# Patient Record
Sex: Female | Born: 1990 | Race: Black or African American | Hispanic: No | Marital: Single | Smoking: Never smoker
Health system: Southern US, Community
[De-identification: ages and names within clinical notes are randomized; demographics above are authoritative.]

## PROBLEM LIST (undated history)

## (undated) DIAGNOSIS — R87629 Unspecified abnormal cytological findings in specimens from vagina: Secondary | ICD-10-CM

## (undated) DIAGNOSIS — B009 Herpesviral infection, unspecified: Secondary | ICD-10-CM

## (undated) DIAGNOSIS — M940 Chondrocostal junction syndrome [Tietze]: Secondary | ICD-10-CM

## (undated) DIAGNOSIS — D649 Anemia, unspecified: Secondary | ICD-10-CM

## (undated) DIAGNOSIS — F419 Anxiety disorder, unspecified: Secondary | ICD-10-CM

## (undated) HISTORY — DX: Unspecified abnormal cytological findings in specimens from vagina: R87.629

## (undated) HISTORY — DX: Anemia, unspecified: D64.9

## (undated) HISTORY — PX: COLPOSCOPY: SHX161

---
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
## 2010-10-24 ENCOUNTER — Encounter: Payer: Self-pay | Admitting: *Deleted

## 2011-02-19 NOTE — H&P (Signed)
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  The patient  did overdose January 25, 2006 with 8-9 pills unidentified and was upset that  she had no detrimental effects.  Her school counselor found 10 pills on the  patient at school and sent her to [facility] mental health late last  week and she was scheduled for an outpatient follow-up assessment Feb 02, 2006.  The patient reported to school today that she is having the voice  again telling her to overdose with 10 pills.   HISTORY OF PRESENT ILLNESS:  The patient is a significantly intelligent  person who is not currently communicating or processing for understanding of  her behavior and symptoms that will allow adequate mental health  intervention for her.  The patient in that way cannot contract for safety.  She reports that people at school been planning to jump her.  She has  conflict with mother and with school.  [facility] mental health will  likely not give any further appointments or care and she will need to have  subsequent care within the network and authorization of [employer].  The patient does not use alcohol or illicit drugs.  She has no other  known organic central nervous system trauma.  She is on no medications  except Allegra 60 mg daily as  needed from Dr. Durenda Guthrie for allergy.  The  patient will not participate sufficiently in the assessment to clarify the  differential diagnosis and establish family, school or community  containment.  Mother reportedly has ADHD.  The patient lives with mother and  two brothers.  Mother does not want pharmacotherapy including for the  patient.  Mother has been trying to facilitate social reorganization or  reconnection at church and the patient is scheduled to be baptized this  Sunday.  Mother prefers a short hospitalization and to get the patient back  to home at the same time that she states she wants the best for her daughter  and does not want to antagonize in any way necessary treatments.  Mother  suggests that she herself does not have trust in  mental health treatments  nor does she have confidence in others.  The patient does not acknowledge  other dissociative symptoms.  She does not acknowledge other psychic trauma  particularly overwhelming trauma.  They do not acknowledge information about  biological father or other genetic diathesis.  Patient is only on Allegra.  The patient is not currently acknowledging alcohol  or illicit drug use.  She  does not smoke cigarettes.  The patient is generally considered high  achieving and upright teenager by mother.  Mother feels that once the  patient gains momentum in working on and beginning to take care of problem  resolution that she will be able to sustain this without any other treatment  especially medications.  Mother asks for help getting the patient started.  Mother particularly emphasizes that she would not want the patient on any  medication or under more than supportive counseling for more than 3 or 4  months.  Mother does not clarify other pertinent history either and seems  closed to part of communication similar to the patient.   PAST MEDICAL HISTORY:  The patient is under the primary care of Dr. Vianne Bulls.  The patient  reports being in general good health.  She has  seasonal allergic rhinitis and uses Allegra 60 mg daily as needed when  needed.  Last menses was January 15, 2006.  The patient does not acknowledge  sexual activity.  She has contusions on both legs from playing basketball.  She has episodic headaches.  She has no medication allergies.  She denies  syncope or seizure.  She denies heart murmur or arrhythmia.  She denies  other organic central nervous system trauma.   REVIEW OF SYSTEMS:  The patient denies difficulty with gait, gaze or  continence.  She denies exposure to communicable disease or toxins.  She  denies sensory loss, trouble with coordination, or loss of memory.  She  denies cough, congestion or chest pain.  She has no palpitations or  presyncope.  She has no rash, jaundice or purpura.  There is no abdominal  pain, nausea, vomiting or diarrhea.  There is no dysuria arthralgia.   Immunizations are up-to-date.   FAMILY HISTORY:  Mother reportedly has ADHD but does not clarify this  herself or whether there any medications.  The patient resides with mother  and two brothers.  Mother works with [employer].  They do not  acknowledge the whereabouts or status of father.   SOCIAL AND DEVELOPMENTAL HISTORY:  The patient is an eighth grade student at  the [employer].  She reports grades are As and Bs.  Mother indicates  the patient is very talented and academically capable.  The patient has been  labile relative to applying herself lately.  She has been more disruptive.  However they do not provide specific examples of legal consequences or  antisocial behavior.  The patient does not acknowledge sexual activity.  The  patient does not acknowledge substance abuse with alcohol or illicit drugs.   ASSETS:  The patient is intelligent.   MENTAL STATUS EXAM:  Height is 61 inches and weight is 98 pounds.  Blood pressure is 134/86 with heart rate of 86 sitting and 127/87 with  heart rate  of 80 standing.  She is right-handed.  She is alert and oriented with speech  intact though she offers limited spontaneous elaboration.  She has  diminished prosody of speech and is somewhat withdrawn.  She has no  abnormalities of muscle strength and tone.  She has alternating motion rates  intact.  She has no abnormal involuntary movements.  There are no pathologic  reflexes or soft neurologic findings.  Gait and gaze are intact.  The  patient is hypersensitive to the comments or actions of others with  rejection sensitivity.  She seems to shut down  and give up in ways that  contribute to explanation of her hallucinations and suicidal ideation.  The  patient has acted on the suicide impulses and overdose reporting that the  voices are telling her to do so again.  She has reported that people are  planning to jump on her suggesting possibly some atypical depressive  features or atypical hallucinations.  The patient does not present any  acknowledgment of anxiety.  She does not present obsessive compulsive  fixations.  She has no dissociative symptoms or post-traumatic flashbacks.  She does not present delirium or other disorganization.  There is no other  organicity at this time.  She has suicidal ideation and plan to overdose.  She is not homicidal or assaultive   IMPRESSION:  AXIS I:  1.  Psychotic disorder not otherwise specified.  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  3.  Rule out oppositional defiant disorder (provisional diagnosis).  4.  Parent-child problem.  5.  Other specified family circumstances  6.  Other interpersonal problem.  AXIS II:  Diagnosis deferred.  AXIS III:  1.  Allergic rhinitis.  2.  Contusions both legs from basketball.  AXIS IV:  Stressors school moderate acute and chronic; family moderate acute  and chronic; phase of life moderate acute and chronic.  AXIS V:  Global assessment of functioning on admission is 38 with highest in   last year estimated at 82.   PLAN:  The patient is admitted for inpatient adolescent psychiatric and  multidisciplinary multimodal behavioral health treatment in a team-based  program at a locked psychiatric unit.  The patient is a good candidate for  psychotherapy if she will open up instead of shutting down.  Development of  the capacity for such therapy is essential as a first step.  Will continue  to monitor and assess possible need for antidepressant pharmacotherapy such  as Luvox as ongoing psychotherapeutic treatment proceeds.  Cognitive  behavioral therapy, anger management, exposure and response prevention,  family therapy, individuation separation, communication and social skills  and problem-solving and coping skills can be undertaken.  Estimated length  of stay is 6 days with target symptoms for discharge being stabilization of  suicide risk and mood, stabilization of persecutory symptoms and command auditory hallucinations and generalization of the capacity for safe  effective participation in outpatient treatment.  The patient's mother  indicates the patient is to be baptized at church Feb 06, 2006 and has had  preparations for such for several weeks.      Lalla Brothers, MD  Electronically Signed     GEJ/MEDQ  D:  02/01/2006  T:  02/02/2006  Job:  [id number]

## 2011-02-19 NOTE — Discharge Summary (Signed)
[name], [name]             ACCOUNT NO.:  [id number]   MEDICAL RECORD NO.:  [id number]          PATIENT TYPE:  INP   LOCATION:  0107                          FACILITY:  BH   PHYSICIAN:  Lalla Brothers, MDDATE OF BIRTH:  06-19-1991   DATE OF ADMISSION:  01/31/2006  DATE OF DISCHARGE:  02/05/2006                                 DISCHARGE SUMMARY   IDENTIFICATION:  19 year old female eighth grade student at [employer] was admitted emergently voluntarily   DICTATION ENDED HERE.      Lalla Brothers, MD  Electronically Signed     GEJ/MEDQ  D:  02/09/2006  T:  02/10/2006  Job:  [id number]

## 2011-02-19 NOTE — Discharge Summary (Signed)
[name], [name]             ACCOUNT NO.:  [id number]   MEDICAL RECORD NO.:  [id number]          PATIENT TYPE:  INP   LOCATION:  0107                          FACILITY:  BH   PHYSICIAN:  Lalla Brothers, MDDATE OF BIRTH:  1991/05/22   DATE OF ADMISSION:  01/31/2006  DATE OF DISCHARGE:  02/05/2006                                 DISCHARGE SUMMARY   IDENTIFICATION:  This 20 year old female, eighth grade student at [employer]  [facility] [employer] [facility] [employer] [employer], was admitted emergently voluntarily as brought by  [facility] for inpatient  stabilization and treatment of suicide risk, depression and episodic command  auditory hallucinations to harm herself by overdose.  The patient did  overdose January 25, 2006 with 8 or 9 unidentified pills and was upset that  there was no detrimental effect.  Her school counselor found in her  possession 10 pills subsequently at school and sent her to [facility] where she was scheduled for outpatient follow-up Feb 02, 2006.  She apparently disclosed to the school on the day of admission that  the voices were telling her to overdose with 10 pills again.  For full  details, please see the typed admission assessment.   SYNOPSIS OF PRESENT ILLNESS:  The patient lives with mother, stepfather and  two brothers and has no relationship with biological father apparently in  [place].  The patient talks well with mother but is not close to  stepfather.  Grades are down in two classes currently.  Mother has adult  ADHD, depression and a suicide attempt by overdose at age 22.  Maternal  uncles have substance abuse with cocaine and paternal grandmother has  cancer.  The patient takes Allegra 60 mg daily as needed from Dr. Earna Coder  for allergic rhinitis.  The patient is high-achieving and mother considers  her upright including in her prayer life.  Mother works at [employer].  The  patient was evaluated at [facility] for fainting spells as a child with  apparently a negative workup.  The patient is on no medications except the  Allegra.   INITIAL MENTAL STATUS EXAM:  The patient had diminished prosody of speech  and was somewhat withdrawn.  She was hypersensitive to the comments or  actions of others with rejection sensitivity.  She shuts down and fixates on  sense that voices are telling her to suicide by overdose..  Exam was  otherwise intact on admission with no loose associations or florid  disorganization.   LABORATORY FINDINGS:  CBC was normal except platelet count elevated 427,000  with upper limit of normal 420,000.  White count was normal at 8600,  hemoglobin 14.2 and MCV of 89.  Comprehensive metabolic panel was normal  with sodium at 141, potassium 4.5, random glucose 98, creatinine 0.8,  calcium 10, albumin 4.6, AST 24 and ALT 13 with GGT 18.  Free T4 was normal  at 1.28 and TSH at 2.307.  Urine HCG was negative.  Urine drug screen was  negative with creatinine of 216 mg/dL documenting  specimen to be adequate.  RPR was nonreactive.  Urine probe for gonorrhea and chlamydia trachomatis by  DNA amplification were both negative.   HOSPITAL COURSE AND TREATMENT:  General medical exam by Jorje Guild PA-C noted  no medication allergies though she has seasonal allergies treated with  Allegra 60 mg p.r.n.  She is not sexually active.  Exam was otherwise  unremarkable.  Height was 61 inches and weight 98 pounds on admission.  Blood pressure was 134/86 with heart rate of 86 (sitting) and 127/87 with  heart rate of 80 (standing) on admission.  At the time of discharge, supine  blood pressure was 105/67 with heart rate of 80 and standing blood pressure  121/70 with heart rate of 83.  The patient gradually but steadily engaged in  treatment.  There are no further hallucinations.  Mother declined any  consideration of pharmacotherapy for depression.  The patient  participated  actively in treatment and FMLA form was completed for mother with the  patient's date and permission.  Family therapy with mother and stepfather  formulated and implemented honest communication among the three and  confidence that suicidal ideation had resolved.  There were no auditory  hallucinations even under the stress of the family session.  The patient  began to manifest interest in own family members and in returning home.  Her  overdose was for depression in her final analysis and she improved in mood  and was generalizing improved relations and behavior that can facilitate  sustaining the bright positive mood she had at discharge.  She required no  seclusion or restraint during the hospital stay and was discharged on no  medications.   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  Depressive disorder not otherwise  specified with atypical features.  Parent-child problem.  Other specified  family circumstances.  Other interpersonal problem.  AXIS II:  Diagnosis deferred.  AXIS III:  Allergic rhinitis, contusions, both legs from basketball,  reactive thrombocytosis.  AXIS IV:  Stressors:  School--moderate, acute and chronic; family--moderate,  acute and chronic; phase of life--moderate, acute and chronic.  AXIS V:  GAF on admission 38; highest in last year 82; discharge GAF 56.   CONDITION ON DISCHARGE:  The patient was discharged to mother in improved  condition on a regular diet with no restrictions on activity.  Crisis and  safety plans are outlined if needed.   DISCHARGE MEDICATIONS:  She is on no medications.   FOLLOW UP:  She will see Carollee Massed at [facility] at Feb 09, 2006 at  0900 for aftercare therapy.  Mother's FMLA form was completed to ensure that  mother can be available to the patient in a return home and for initial and  ongoing therapy needs.      Lalla Brothers,  MD Electronically Signed     GEJ/MEDQ  D:  02/09/2006  T:  02/10/2006  Job:  [id number]   cc:   Carollee Massed  [facility]  [address] [place], [place] [address]

## 2011-07-13 LAB — URINALYSIS, ROUTINE W REFLEX MICROSCOPIC
Bilirubin Urine: NEGATIVE
Glucose, UA: NEGATIVE
Hgb urine dipstick: NEGATIVE
Ketones, ur: 15 — AB
Nitrite: NEGATIVE
Protein, ur: NEGATIVE
Specific Gravity, Urine: 1.026
Urobilinogen, UA: 1
pH: 6.5

## 2011-07-13 LAB — DIFFERENTIAL
Basophils Absolute: 0
Basophils Relative: 1
Eosinophils Absolute: 0.1 — ABNORMAL LOW
Eosinophils Relative: 1
Lymphocytes Relative: 20 — ABNORMAL LOW
Lymphs Abs: 1.5
Monocytes Absolute: 0.5
Monocytes Relative: 7
Neutro Abs: 5.4
Neutrophils Relative %: 72 — ABNORMAL HIGH

## 2011-07-13 LAB — CBC
HCT: 38.6
Hemoglobin: 12.9
MCHC: 33.4
MCV: 90.1
Platelets: 362
RBC: 4.28
RDW: 13.2
WBC: 7.4

## 2011-07-13 LAB — I-STAT 8, (EC8 V) (CONVERTED LAB)
Acid-base deficit: 2
BUN: 8
Bicarbonate: 23
Chloride: 108
Glucose, Bld: 103 — ABNORMAL HIGH
HCT: 42
Hemoglobin: 14.3
Operator id: 285491
Potassium: 3.6
Sodium: 139
TCO2: 24
pCO2, Ven: 40.9 — ABNORMAL LOW
pH, Ven: 7.357 — ABNORMAL HIGH

## 2011-07-13 LAB — POCT I-STAT CREATININE
Creatinine, Ser: 0.9
Operator id: 285491

## 2011-07-13 LAB — POCT PREGNANCY, URINE
Operator id: 28549
Preg Test, Ur: NEGATIVE

## 2012-03-03 ENCOUNTER — Encounter (HOSPITAL_COMMUNITY): Payer: Self-pay | Admitting: Emergency Medicine

## 2012-03-03 ENCOUNTER — Emergency Department (HOSPITAL_COMMUNITY): Payer: PRIVATE HEALTH INSURANCE

## 2012-03-03 ENCOUNTER — Emergency Department (HOSPITAL_COMMUNITY)
Admission: EM | Admit: 2012-03-03 | Discharge: 2012-03-03 | Disposition: A | Discharge status: Home / Self Care | Payer: PRIVATE HEALTH INSURANCE | Attending: Emergency Medicine | Admitting: Emergency Medicine

## 2012-03-03 DIAGNOSIS — R079 Chest pain, unspecified: Secondary | ICD-10-CM | POA: Insufficient documentation

## 2012-03-03 DIAGNOSIS — R51 Headache: Secondary | ICD-10-CM | POA: Insufficient documentation

## 2012-03-03 DIAGNOSIS — R42 Dizziness and giddiness: Secondary | ICD-10-CM | POA: Insufficient documentation

## 2012-03-03 DIAGNOSIS — R002 Palpitations: Secondary | ICD-10-CM | POA: Insufficient documentation

## 2012-03-03 HISTORY — DX: Chondrocostal junction syndrome (tietze): M94.0

## 2012-03-03 HISTORY — DX: Anxiety disorder, unspecified: F41.9

## 2012-03-03 LAB — CBC
HCT: 37 % (ref 36.0–46.0)
Hemoglobin: 13 g/dL (ref 12.0–15.0)
MCH: 30.1 pg (ref 26.0–34.0)
MCHC: 35.1 g/dL (ref 30.0–36.0)
MCV: 85.6 fL (ref 78.0–100.0)
Platelets: 342 10*3/uL (ref 150–400)
RBC: 4.32 MIL/uL (ref 3.87–5.11)
RDW: 12.6 % (ref 11.5–15.5)
WBC: 7.3 10*3/uL (ref 4.0–10.5)

## 2012-03-03 LAB — BASIC METABOLIC PANEL
BUN: 10 mg/dL (ref 6–23)
CO2: 26 mEq/L (ref 19–32)
Calcium: 9.5 mg/dL (ref 8.4–10.5)
Chloride: 101 mEq/L (ref 96–112)
Creatinine, Ser: 0.81 mg/dL (ref 0.50–1.10)
GFR calc Af Amer: >90 mL/min (ref >90)
GFR calc non Af Amer: >90 mL/min (ref >90)
Glucose, Bld: 88 mg/dL (ref 70–99)
Potassium: 3.5 mEq/L (ref 3.5–5.1)
Sodium: 139 mEq/L (ref 135–145)

## 2012-03-03 LAB — DIFFERENTIAL
Basophils Absolute: 0 10*3/uL (ref 0.0–0.1)
Basophils Relative: 1 % (ref 0–1)
Eosinophils Absolute: 0.2 10*3/uL (ref 0.0–0.7)
Eosinophils Relative: 3 % (ref 0–5)
Lymphocytes Relative: 56 % — ABNORMAL HIGH (ref 12–46)
Lymphs Abs: 4.1 10*3/uL — ABNORMAL HIGH (ref 0.7–4.0)
Monocytes Absolute: 0.5 10*3/uL (ref 0.1–1.0)
Monocytes Relative: 7 % (ref 3–12)
Neutro Abs: 2.4 10*3/uL (ref 1.7–7.7)
Neutrophils Relative %: 33 % — ABNORMAL LOW (ref 43–77)

## 2012-03-03 LAB — POCT I-STAT TROPONIN I: Troponin i, poc: 0 ng/mL (ref 0.00–0.08)

## 2012-03-03 MED ORDER — OMEPRAZOLE 20 MG PO CPDR: 20.0000 mg | DELAYED_RELEASE_CAPSULE | Freq: Every day | ORAL | Status: DC

## 2012-03-03 NOTE — ED Notes (Signed)
Pt stated that she has been having intermittent chest pain x 2 weeks. Pain has been an aching feeling. Pt stated that she also has intermittent lightheadedness with CP. Pain is in the middle of chest. Does not radiate. No N/v or SOB. No neurological deficits. Currently not experiencing CP or any neurological deficits. Will continue to monitor.

## 2012-03-03 NOTE — Discharge Instructions (Signed)
Palpitations  A palpitation is the feeling that your heartbeat is irregular or is faster than normal. Although this is frightening, it usually is not serious. Palpitations may be caused by excesses of smoking, caffeine, or alcohol. They are also brought on by stress and anxiety. Sometimes, they are caused by heart disease. Unless otherwise noted, your caregiver did not find any signs of serious illness at this time. HOME CARE INSTRUCTIONS  To help prevent palpitations:  Drink decaffeinated coffee, tea, and soda pop. Avoid chocolate.   If you smoke or drink alcohol, quit or cut down as much as possible.   Reduce your stress or anxiety level. Biofeedback, yoga, or meditation will help you relax. Physical activity such as swimming, jogging, or walking also may be helpful.  SEEK MEDICAL CARE IF:   You continue to have a fast heartbeat.   Your palpitations occur more often.  SEEK IMMEDIATE MEDICAL CARE IF: You develop chest pain, shortness of breath, severe headache, dizziness, or fainting. Document Released: 09/17/2000 Document Revised: 09/09/2011 Document Reviewed: 11/17/2007 ExitCare Patient Information 2012 [employer]. 

## 2012-03-03 NOTE — ED Provider Notes (Signed)
History     CSN: [id number]  Arrival date & time 03/03/12  2053   First MD Initiated Contact with Patient 03/03/12 2259      Chief Complaint  Patient presents with  . Chest Pain    (Consider location/radiation/quality/duration/timing/severity/associated sxs/prior treatment) The history is provided by the patient and a parent.   patient reports intermittent chest tightness for approximately 2 weeks with occasional palpitations and lightheadedness.  She's had no syncope.  She reports she occasionally becomes slightly short of breath.  She's had no fevers or chills.  She denies nausea vomiting or diarrhea.  She has no melena or hematochezia.  Her chest discomfort is described as a tightness without radiation.  It lasts approximately one to 2 hours.  It is intermittent to the days.  Has been persistent over the past 2 weeks.  It is not worsening or improving.  She'll workup as a young child of around 40 or 35 years of age for syncope.  At that time she was seen by the pediatric cardiologist with what sounds like an echocardiogram no clear diagnosis as to the cause of her symptoms.  She is otherwise without complaints.  Her symptoms are mild when they occur.  Her symptoms did not occur with exertion.  They're not associated with food.  They're not associated with position  Past Medical History  Diagnosis Date  . Anxiety   . Costochondritis     History reviewed. No pertinent past surgical history.  No family history on file.  History  Substance Use Topics  . Smoking status: Never Smoker   . Smokeless tobacco: Not on file  . Alcohol Use: No    OB History    Grav Para Term Preterm Abortions TAB SAB Ect Mult Living                  Review of Systems  Cardiovascular: Positive for chest pain.  All other systems reviewed and are negative.    Allergies  Review of patient's allergies indicates no known allergies.  Home Medications   Current Outpatient Rx  Name Route Sig Dispense  Refill  . OMEPRAZOLE 20 MG PO CPDR Oral Take 1 capsule (20 mg total) by mouth daily. 30 capsule 0    BP 126/70  Pulse 84  Temp(Src) 98.8 F (37.1 C) (Oral)  Resp 18  SpO2 99%  Physical Exam  Nursing note and vitals reviewed. Constitutional: She is oriented to person, place, and time. She appears well-developed and well-nourished. No distress.  HENT:  Head: Normocephalic and atraumatic.  Eyes: EOM are normal.  Neck: Normal range of motion.  Cardiovascular: Normal rate, regular rhythm and normal heart sounds.   Pulmonary/Chest: Effort normal and breath sounds normal.  Abdominal: Soft. She exhibits no distension. There is no tenderness.  Musculoskeletal: Normal range of motion.  Neurological: She is alert and oriented to person, place, and time.  Skin: Skin is warm and dry.  Psychiatric: She has a normal mood and affect. Judgment normal.    ED Course  Procedures (including critical care time)   Date: 03/03/2012  Rate: 86  Rhythm: normal sinus rhythm  QRS Axis: normal  Intervals: normal  ST/T Wave abnormalities: normal  Conduction Disutrbances: none  Narrative Interpretation:   Old EKG Reviewed: No prior EKG available      Labs Reviewed  DIFFERENTIAL - Abnormal; Notable for the following:    Neutrophils Relative 33 (*)    Lymphocytes Relative 56 (*)    Lymphs Abs  4.1 (*)    All other components within normal limits  CBC  BASIC METABOLIC PANEL  POCT I-STAT TROPONIN I   Dg Chest 2 View  03/03/2012  *RADIOLOGY REPORT*  Clinical Data: Chest pain, dizziness, and headache.  CHEST - 2 VIEW  Comparison: 07/24/2004  Findings: Mild pulmonary hyperinflation. The heart size and pulmonary vascularity are normal. The lungs appear clear and expanded without focal air space disease or consolidation. No blunting of the costophrenic angles.  No pneumothorax.  No significant changes since the previous study.  IMPRESSION: No evidence of active pulmonary disease.  Original Report  Authenticated By: Marlon Pel, M.D.     1. Chest pain   2. Palpitations       MDM  Intermittent palpitations and chest pain.  At this time her labs and EKG are normal.  She has no cardiomegaly.  She is no obvious murmurs on exam.  Outpatient cardiology followup for possible Holter monitoring and evaluation.        Lyanne Co, MD 03/03/12 [phone]

## 2012-03-03 NOTE — ED Notes (Signed)
PT. REPORTS INTERMITTENT MID CHEST PAIN FOR 2 WEEKS WITH LIGHTHEADEDNESS , SLIGHT SOB , DENIES COUGH OR NAUSEA.

## 2013-09-27 ENCOUNTER — Encounter (HOSPITAL_COMMUNITY): Payer: Self-pay | Admitting: Emergency Medicine

## 2013-09-27 ENCOUNTER — Emergency Department (HOSPITAL_COMMUNITY)
Admission: EM | Admit: 2013-09-27 | Discharge: 2013-09-27 | Disposition: A | Discharge status: Home / Self Care | Payer: BC Managed Care – PPO | Attending: Emergency Medicine | Admitting: Emergency Medicine

## 2013-09-27 ENCOUNTER — Emergency Department (HOSPITAL_COMMUNITY): Payer: BC Managed Care – PPO

## 2013-09-27 DIAGNOSIS — N390 Urinary tract infection, site not specified: Secondary | ICD-10-CM

## 2013-09-27 DIAGNOSIS — R252 Cramp and spasm: Secondary | ICD-10-CM

## 2013-09-27 DIAGNOSIS — Z79899 Other long term (current) drug therapy: Secondary | ICD-10-CM | POA: Insufficient documentation

## 2013-09-27 DIAGNOSIS — Z3202 Encounter for pregnancy test, result negative: Secondary | ICD-10-CM | POA: Insufficient documentation

## 2013-09-27 DIAGNOSIS — R6883 Chills (without fever): Secondary | ICD-10-CM | POA: Insufficient documentation

## 2013-09-27 DIAGNOSIS — R0789 Other chest pain: Secondary | ICD-10-CM | POA: Insufficient documentation

## 2013-09-27 DIAGNOSIS — R05 Cough: Secondary | ICD-10-CM | POA: Insufficient documentation

## 2013-09-27 DIAGNOSIS — F411 Generalized anxiety disorder: Secondary | ICD-10-CM | POA: Insufficient documentation

## 2013-09-27 DIAGNOSIS — R059 Cough, unspecified: Secondary | ICD-10-CM | POA: Insufficient documentation

## 2013-09-27 LAB — CBC
HCT: 39.3 % (ref 36.0–46.0)
Hemoglobin: 13.9 g/dL (ref 12.0–15.0)
MCH: 31.1 pg (ref 26.0–34.0)
MCHC: 35.4 g/dL (ref 30.0–36.0)
MCV: 87.9 fL (ref 78.0–100.0)
Platelets: 343 10*3/uL (ref 150–400)
RBC: 4.47 MIL/uL (ref 3.87–5.11)
RDW: 12.8 % (ref 11.5–15.5)
WBC: 7.7 10*3/uL (ref 4.0–10.5)

## 2013-09-27 LAB — URINALYSIS, ROUTINE W REFLEX MICROSCOPIC
Bilirubin Urine: NEGATIVE
Glucose, UA: NEGATIVE mg/dL
Hgb urine dipstick: NEGATIVE
Ketones, ur: NEGATIVE mg/dL
Nitrite: NEGATIVE
Protein, ur: 30 mg/dL — AB
Specific Gravity, Urine: 1.026 (ref 1.005–1.030)
Urobilinogen, UA: 1 mg/dL (ref 0.0–1.0)
pH: 7 (ref 5.0–8.0)

## 2013-09-27 LAB — BASIC METABOLIC PANEL
BUN: 11 mg/dL (ref 6–23)
CO2: 24 mEq/L (ref 19–32)
Calcium: 9.5 mg/dL (ref 8.4–10.5)
Chloride: 102 mEq/L (ref 96–112)
Creatinine, Ser: 0.78 mg/dL (ref 0.50–1.10)
GFR calc Af Amer: >90 mL/min (ref >90)
GFR calc non Af Amer: >90 mL/min (ref >90)
Glucose, Bld: 87 mg/dL (ref 70–99)
Potassium: 3.9 mEq/L (ref 3.5–5.1)
Sodium: 140 mEq/L (ref 135–145)

## 2013-09-27 LAB — POCT PREGNANCY, URINE: Preg Test, Ur: NEGATIVE

## 2013-09-27 LAB — URINE MICROSCOPIC-ADD ON

## 2013-09-27 MED ORDER — DEXTROSE 5 % IV SOLN
1.0000 g | Freq: Once | INTRAVENOUS | Status: AC
  Administered 2013-09-27: 1 g via INTRAVENOUS
  Filled 2013-09-27: qty 10

## 2013-09-27 MED ORDER — ONDANSETRON 4 MG PO TBDP
4.0000 mg | ORAL_TABLET | Freq: Once | ORAL | Status: AC
  Administered 2013-09-27: 4 mg via ORAL
  Filled 2013-09-27: qty 1

## 2013-09-27 MED ORDER — CEPHALEXIN 500 MG PO CAPS: 500.0000 mg | ORAL_CAPSULE | Freq: Four times a day (QID) | ORAL | Status: DC

## 2013-09-27 MED ORDER — ACETAMINOPHEN 325 MG PO TABS
650.0000 mg | ORAL_TABLET | Freq: Once | ORAL | Status: AC
  Administered 2013-09-27: 650 mg via ORAL
  Filled 2013-09-27: qty 2

## 2013-09-27 MED ORDER — SODIUM CHLORIDE 0.9 % IV BOLUS (SEPSIS)
1000.0000 mL | Freq: Once | INTRAVENOUS | Status: AC
  Administered 2013-09-27: 1000 mL via INTRAVENOUS

## 2013-09-27 MED ORDER — DIAZEPAM 5 MG PO TABS: 5.0000 mg | ORAL_TABLET | Freq: Three times a day (TID) | ORAL | Status: DC | PRN

## 2013-09-27 NOTE — ED Notes (Signed)
Pt states that she has been having nausea and emesis today since 8AM. Pt states she has thrown up 8 or 9 times today. Pt states she is unable to keep food or liquids to stay down. Pt states she also has shortness of breath, headache and chest tightness. Pt states she has also had back spasms and feet swelling, for the past 2 months. Pt denies abdominal pain. Pt took zofran for nausea this morning at 9AM, which offered no relief.

## 2013-09-27 NOTE — ED Provider Notes (Signed)
CSN: [id number]     Arrival date & time 09/27/13  1308 History   First MD Initiated Contact with Patient 09/27/13 1317     Chief Complaint  Patient presents with  . Nausea  . Emesis   (Consider location/radiation/quality/duration/timing/severity/associated sxs/prior Treatment) Patient is a 22 y.o. female presenting with vomiting. The history is provided by the patient.  Emesis Severity:  Moderate Timing:  Constant Number of daily episodes:  9 Quality:  Stomach contents Feeding tolerance: nothing. Progression:  Worsening Chronicity:  Recurrent (happened 2 weeks ago) Recent urination:  Normal Context: not post-tussive   Relieved by:  Nothing Worsened by:  Nothing tried Ineffective treatments:  Antiemetics Associated symptoms: chills   Associated symptoms: no abdominal pain and no diarrhea     Past Medical History  Diagnosis Date  . Anxiety   . Costochondritis    History reviewed. No pertinent past surgical history. No family history on file. History  Substance Use Topics  . Smoking status: Never Smoker   . Smokeless tobacco: Not on file  . Alcohol Use: Yes   OB History   Grav Para Term Preterm Abortions TAB SAB Ect Mult Living                 Review of Systems  Constitutional: Positive for chills. Negative for fever.  Respiratory: Positive for chest tightness. Negative for cough and shortness of breath.   Cardiovascular: Negative for chest pain and leg swelling.  Gastrointestinal: Positive for nausea and vomiting. Negative for abdominal pain and diarrhea.  All other systems reviewed and are negative.    Allergies  Review of patient's allergies indicates no known allergies.  Home Medications   Current Outpatient Rx  Name  Route  Sig  Dispense  Refill  . citalopram (CELEXA) 20 MG tablet   Oral   Take 20 mg by mouth daily.         Marland Kitchen ibuprofen (ADVIL,MOTRIN) 800 MG tablet   Oral   Take 800 mg by mouth every 8 (eight) hours as needed for moderate pain.         Marland Kitchen ondansetron (ZOFRAN) 4 MG tablet   Oral   Take 4 mg by mouth every 8 (eight) hours as needed for nausea or vomiting.          BP 121/67  Pulse 103  Temp(Src) 99.4 F (37.4 C) (Oral)  SpO2 97% Physical Exam  Nursing note and vitals reviewed. Constitutional: She is oriented to person, place, and time. She appears well-developed and well-nourished. No distress.  HENT:  Head: Normocephalic and atraumatic.  Eyes: EOM are normal. Pupils are equal, round, and reactive to light.  Neck: Normal range of motion. Neck supple.  Cardiovascular: Normal rate and regular rhythm.  Exam reveals no friction rub.   No murmur heard. Pulmonary/Chest: Effort normal and breath sounds normal. No respiratory distress. She has no wheezes. She has no rales.  Abdominal: Soft. She exhibits no distension. There is no tenderness. There is no rebound.  Musculoskeletal: Normal range of motion. She exhibits no edema.  Neurological: She is alert and oriented to person, place, and time. No cranial nerve deficit. She exhibits normal muscle tone. Coordination normal.  Skin: No rash noted. She is not diaphoretic.    ED Course  Procedures (including critical care time) Labs Review Labs Reviewed  URINALYSIS, ROUTINE W REFLEX MICROSCOPIC - Abnormal; Notable for the following:    APPearance HAZY (*)    Protein, ur 30 (*)    Leukocytes,  UA SMALL (*)    All other components within normal limits  URINE MICROSCOPIC-ADD ON - Abnormal; Notable for the following:    Bacteria, UA MANY (*)    All other components within normal limits  URINE CULTURE  CBC  BASIC METABOLIC PANEL  POCT PREGNANCY, URINE   Imaging Review Dg Chest 2 View  09/27/2013   CLINICAL DATA:  Chest pain, nausea  EXAM: CHEST  2 VIEW  COMPARISON:  03/03/2012  FINDINGS: The heart size and mediastinal contours are within normal limits. Both lungs are clear. The visualized skeletal structures are unremarkable.  IMPRESSION: No active  cardiopulmonary disease.   Electronically Signed   By: Ruel Favors M.D.   On: 09/27/2013 14:23    EKG Interpretation    Date/Time:  Thursday September 27 2013 14:28:10 EST Ventricular Rate:  90 PR Interval:  149 QRS Duration: 76 QT Interval:  362 QTC Calculation: 443 R Axis:   103 Text Interpretation:  Sinus rhythm Borderline right axis deviation No significant change was found Confirmed by Gwendolyn Grant  MD, Shawntia Mangal ([id number]) on 09/27/2013 2:37:35 PM            MDM   1. UTI (lower urinary tract infection)   2. Muscle cramps    Patient is a 22 year old female presents with nausea and vomiting. She's had 8 or 9 episodes today. She denies any fever, dysuria, diarrhea. She denies any abdominal pain. She is unable to keep food or liquids down. She had this happen about 2 weeks ago, was given Zofran. Since this happened 2 weeks ago, she had no other vomiting until today. She did have continuous muscle cramps since then though. She is having those of muscle relaxers or ibuprofen. She has no medical problems. She does report a mild cough and some mild chest tightness. Here her vitals show very mild tachycardia @ 103. She is normotensive. Patient has a benign abdomen. Will check labs, CXR and give fluids.  Labs show UTI. Rocephin given. Given resource guide for f/u. Instructed to take Valium for spasms.   Dagmar Hait, MD 09/27/13 [phone]

## 2013-09-28 LAB — URINE CULTURE
Colony Count: NO GROWTH
Culture: NO GROWTH

## 2016-05-05 ENCOUNTER — Encounter (HOSPITAL_COMMUNITY): Payer: Self-pay | Admitting: *Deleted

## 2016-05-05 ENCOUNTER — Emergency Department (HOSPITAL_COMMUNITY)
Admission: EM | Admit: 2016-05-05 | Discharge: 2016-05-05 | Disposition: A | Discharge status: Home / Self Care | Payer: PRIVATE HEALTH INSURANCE | Attending: Emergency Medicine | Admitting: Emergency Medicine

## 2016-05-05 DIAGNOSIS — R197 Diarrhea, unspecified: Secondary | ICD-10-CM

## 2016-05-05 DIAGNOSIS — R112 Nausea with vomiting, unspecified: Secondary | ICD-10-CM

## 2016-05-05 DIAGNOSIS — Z79899 Other long term (current) drug therapy: Secondary | ICD-10-CM | POA: Insufficient documentation

## 2016-05-05 LAB — COMPREHENSIVE METABOLIC PANEL
ALT: 17 U/L (ref 14–54)
AST: 27 U/L (ref 15–41)
Albumin: 4.4 g/dL (ref 3.5–5.0)
Alkaline Phosphatase: 38 U/L (ref 38–126)
Anion gap: 9 (ref 5–15)
BUN: 8 mg/dL (ref 6–20)
CHLORIDE: 112 mmol/L — AB (ref 101–111)
CO2: 20 mmol/L — AB (ref 22–32)
Calcium: 9.2 mg/dL (ref 8.9–10.3)
Creatinine, Ser: 0.76 mg/dL (ref 0.44–1.00)
GFR calc non Af Amer: >60 mL/min (ref >60)
Glucose, Bld: 124 mg/dL — ABNORMAL HIGH (ref 65–99)
Potassium: 3.5 mmol/L (ref 3.5–5.1)
SODIUM: 141 mmol/L (ref 135–145)
Total Bilirubin: 0.4 mg/dL (ref 0.3–1.2)
Total Protein: 7.4 g/dL (ref 6.5–8.1)

## 2016-05-05 LAB — URINALYSIS, ROUTINE W REFLEX MICROSCOPIC
Bilirubin Urine: NEGATIVE
GLUCOSE, UA: NEGATIVE mg/dL
Hgb urine dipstick: NEGATIVE
Ketones, ur: NEGATIVE mg/dL
Leukocytes, UA: NEGATIVE
Nitrite: NEGATIVE
PH: 7.5 (ref 5.0–8.0)
PROTEIN: 100 mg/dL — AB
Specific Gravity, Urine: 1.029 (ref 1.005–1.030)

## 2016-05-05 LAB — I-STAT BETA HCG BLOOD, ED (MC, WL, AP ONLY): I-stat hCG, quantitative: <5 m[IU]/mL (ref <5)

## 2016-05-05 LAB — CBC
HCT: 38.1 % (ref 36.0–46.0)
Hemoglobin: 13.1 g/dL (ref 12.0–15.0)
MCH: 30.7 pg (ref 26.0–34.0)
MCHC: 34.4 g/dL (ref 30.0–36.0)
MCV: 89.2 fL (ref 78.0–100.0)
Platelets: 291 10*3/uL (ref 150–400)
RBC: 4.27 MIL/uL (ref 3.87–5.11)
RDW: 12.6 % (ref 11.5–15.5)
WBC: 10.8 10*3/uL — ABNORMAL HIGH (ref 4.0–10.5)

## 2016-05-05 LAB — URINE MICROSCOPIC-ADD ON

## 2016-05-05 LAB — LIPASE, BLOOD: LIPASE: 56 U/L — AB (ref 11–51)

## 2016-05-05 MED ORDER — ONDANSETRON 4 MG PO TBDP: 4.0000 mg | ORAL_TABLET | Freq: Three times a day (TID) | ORAL | 0 refills | Status: DC | PRN

## 2016-05-05 MED ORDER — ONDANSETRON 4 MG PO TBDP
4.0000 mg | ORAL_TABLET | Freq: Once | ORAL | Status: AC | PRN
  Administered 2016-05-05: 4 mg via ORAL

## 2016-05-05 MED ORDER — ONDANSETRON 4 MG PO TBDP
ORAL_TABLET | ORAL | Status: AC
  Filled 2016-05-05: qty 1

## 2016-05-05 NOTE — ED Triage Notes (Signed)
Pt reports onset of n/v/d since this am. No acute distress noted at triage.

## 2016-05-05 NOTE — ED Notes (Signed)
Discharge instructions reviewed patient verbalize understanding. Patient unable to sign due to hallway,.

## 2016-05-05 NOTE — ED Provider Notes (Signed)
[facility]-EMERGENCY DEPT Provider Note   CSN: [id number] Arrival date & time: 05/05/16  1216  First Provider Contact:  None       History   Chief Complaint Chief Complaint  Patient presents with  . Vomiting  . Diarrhea    HPI [name] is a 25 y.o. female.  The history is provided by the patient. No language interpreter was used.  Emesis   This is a recurrent problem. The current episode started 6 to 12 hours ago. Episode frequency: multiple times today. The problem has not changed since onset.The emesis has an appearance of stomach contents (NBNB). There has been no fever. Associated symptoms include abdominal pain (mild) and diarrhea. Pertinent negatives include no chills, no cough, no fever, no myalgias, no sweats and no URI. Risk factors: no sick contacts.    Past Medical History:  Diagnosis Date  . Anxiety   . Costochondritis     There are no active problems to display for this patient.   History reviewed. No pertinent surgical history.  OB History    No data available       Home Medications    Prior to Admission medications   Medication Sig Start Date End Date Taking? Authorizing Provider  cephALEXin (KEFLEX) 500 MG capsule Take 1 capsule (500 mg total) by mouth 4 (four) times daily. 09/27/13   Elwin Mocha, MD  citalopram (CELEXA) 20 MG tablet Take 20 mg by mouth daily.    Historical Provider, MD  diazepam (VALIUM) 5 MG tablet Take 1 tablet (5 mg total) by mouth every 8 (eight) hours as needed for anxiety (spasms). 09/27/13   Elwin Mocha, MD  ibuprofen (ADVIL,MOTRIN) 800 MG tablet Take 800 mg by mouth every 8 (eight) hours as needed for moderate pain.    Historical Provider, MD  ondansetron (ZOFRAN) 4 MG tablet Take 4 mg by mouth every 8 (eight) hours as needed for nausea or vomiting.    Historical Provider, MD    Family History History reviewed. No pertinent family history.  Social History Social History  Substance Use Topics  . Smoking status:  Never Smoker  . Smokeless tobacco: Not on file  . Alcohol use Yes     Allergies   Review of patient's allergies indicates no known allergies.   Review of Systems Review of Systems  Constitutional: Negative for chills and fever.  HENT: Negative for congestion and rhinorrhea.   Eyes: Positive for discharge.  Respiratory: Negative for cough and shortness of breath.   Cardiovascular: Negative for chest pain.  Gastrointestinal: Positive for abdominal pain (mild), diarrhea and vomiting.  Genitourinary: Negative for dysuria and frequency.  Musculoskeletal: Negative for myalgias.  Skin: Negative for rash.  Neurological: Negative for syncope and light-headedness.  Psychiatric/Behavioral: Negative for agitation and confusion.     Physical Exam Updated Vital Signs BP 132/91 (BP Location: Right Arm)   Pulse 84   Temp 99.4 F (37.4 C) (Oral)   Resp 18   LMP 03/27/2016   SpO2 99%   Physical Exam  Constitutional: She is oriented to person, place, and time. She appears well-developed and well-nourished. No distress.  Comfortable lying in bed, pleasant, cooperative  HENT:  Head: Normocephalic and atraumatic.  Mouth/Throat: Oropharynx is clear and moist. No oropharyngeal exudate.  Eyes: Conjunctivae are normal.  Neck: Normal range of motion. Neck supple. No tracheal deviation present.  Cardiovascular: Normal rate, regular rhythm and normal heart sounds.   No murmur heard. Pulmonary/Chest: Effort normal and breath sounds normal. No  stridor. No respiratory distress. She has no wheezes. She has no rales.  Abdominal: Soft. Bowel sounds are normal. She exhibits no distension. There is no tenderness. There is no rebound and no guarding.  Musculoskeletal: She exhibits no edema.  Neurological: She is alert and oriented to person, place, and time.  Skin: Skin is warm and dry. Capillary refill takes less than 2 seconds. She is not diaphoretic.  No skin tenting  Psychiatric: She has a normal  mood and affect.  Nursing note and vitals reviewed.    ED Treatments / Results  Labs (all labs ordered are listed, but only abnormal results are displayed) Labs Reviewed  LIPASE, BLOOD - Abnormal; Notable for the following:       Result Value   Lipase 56 (*)    All other components within normal limits  COMPREHENSIVE METABOLIC PANEL - Abnormal; Notable for the following:    Chloride 112 (*)    CO2 20 (*)    Glucose, Bld 124 (*)    All other components within normal limits  CBC - Abnormal; Notable for the following:    WBC 10.8 (*)    All other components within normal limits  URINALYSIS, ROUTINE W REFLEX MICROSCOPIC (NOT AT [facility])  I-STAT BETA HCG BLOOD, ED ([facility], [facility], [facility] ONLY)    EKG  EKG Interpretation None       Radiology No results found.  Procedures Procedures (including critical care time)  Medications Ordered in ED Medications  ondansetron (ZOFRAN-ODT) 4 MG disintegrating tablet (not administered)  ondansetron (ZOFRAN-ODT) disintegrating tablet 4 mg (4 mg Oral Given 05/05/16 1300)     Initial Impression / Assessment and Plan / ED Course  I have reviewed the triage vital signs and the nursing notes.  Pertinent labs & imaging results that were available during my care of the patient were reviewed by me and considered in my medical decision making (see chart for details).  Clinical Course   25 year old female With no significant past medical history besides from chronic diarrhea presents to the emergency department for a 1 day episode of nausea, vomiting and diarrhea. Patient denies any blood in her vomit or diarrhea. No bile either, only stomach contents. Denies any hematuria. Abdomen is soft and nondistended. No concern for acute abdomen at this time. Pain has been mild with these episodes as well. Describes it as crampy. Since patient did have multiple episodes of diarrhea and vomiting, basic blood work was sent and it was grossly unremarkable for any acute  emergent finding. Lipase is mildly elevated but not high enough to be concerning for pancreatitis. Patient had an undetectable hCG. UA was not concerning for infection. Patient was tolerating PO after Zofran. Thus there was no need for IV fluid replacement at this time. Clinically, losses appear to be minimal as well. Patient would like to be reestablished with GI doctor locally. CT scan performed a few years ago was grossly unremarkable when evaluating her chronic diarrhea. Does have family history of Crohn's disease, brother. Suspect functional cause for diarrhea in setting of these findings. Discussed the suspicions with patient's family at bedside. They stated understanding and agreement with plan. Usual and customary return precautions for an GI symptoms were discussed. Patient was given a short course of Zofran ODT. Given information as well to establish primary care in the area. Patient tolerated standing up and ambulating without difficulty. Vital signs and patient stable at discharge.   Final Clinical Impressions(s) / ED Diagnoses   Final diagnoses:  Diarrhea, unspecified type  Non-intractable vomiting with nausea, vomiting of unspecified type    New Prescriptions Discharge Medication List as of 05/05/2016  5:09 PM    START taking these medications   Details  ondansetron (ZOFRAN ODT) 4 MG disintegrating tablet Take 1 tablet (4 mg total) by mouth every 8 (eight) hours as needed for nausea or vomiting., Starting Wed 05/05/2016, Print         Maretta Bees, MD 05/06/16 8916    Margarita Grizzle, MD 05/08/16 [phone]

## 2019-11-09 ENCOUNTER — Encounter: Payer: Self-pay | Admitting: General Practice

## 2019-11-19 ENCOUNTER — Encounter: Payer: Self-pay | Admitting: *Deleted

## 2019-11-19 ENCOUNTER — Other Ambulatory Visit: Payer: Self-pay | Admitting: *Deleted

## 2019-11-19 DIAGNOSIS — R768 Other specified abnormal immunological findings in serum: Secondary | ICD-10-CM | POA: Insufficient documentation

## 2019-11-19 DIAGNOSIS — Z3400 Encounter for supervision of normal first pregnancy, unspecified trimester: Secondary | ICD-10-CM | POA: Insufficient documentation

## 2019-11-19 DIAGNOSIS — R7689 Other specified abnormal immunological findings in serum: Secondary | ICD-10-CM | POA: Insufficient documentation

## 2019-11-29 ENCOUNTER — Encounter: Payer: Self-pay | Admitting: Obstetrics and Gynecology

## 2019-11-29 ENCOUNTER — Encounter: Payer: Self-pay | Admitting: General Practice

## 2019-11-29 ENCOUNTER — Other Ambulatory Visit (HOSPITAL_COMMUNITY)
Admission: RE | Admit: 2019-11-29 | Discharge: 2019-11-29 | Disposition: A | Discharge status: Home / Self Care | Payer: Medicaid Other | Source: Ambulatory Visit | Attending: Obstetrics and Gynecology | Admitting: Obstetrics and Gynecology

## 2019-11-29 ENCOUNTER — Other Ambulatory Visit: Payer: Self-pay

## 2019-11-29 ENCOUNTER — Ambulatory Visit (INDEPENDENT_AMBULATORY_CARE_PROVIDER_SITE_OTHER): Payer: Medicaid Other | Admitting: Obstetrics and Gynecology

## 2019-11-29 VITALS — BP 119/77 | HR 99 | Temp 98.0°F | Ht 61.5 in | Wt 99.8 lb

## 2019-11-29 DIAGNOSIS — Z3400 Encounter for supervision of normal first pregnancy, unspecified trimester: Secondary | ICD-10-CM | POA: Insufficient documentation

## 2019-11-29 DIAGNOSIS — R768 Other specified abnormal immunological findings in serum: Secondary | ICD-10-CM | POA: Diagnosis not present

## 2019-11-29 DIAGNOSIS — O210 Mild hyperemesis gravidarum: Secondary | ICD-10-CM | POA: Diagnosis not present

## 2019-11-29 DIAGNOSIS — Z3A10 10 weeks gestation of pregnancy: Secondary | ICD-10-CM | POA: Diagnosis not present

## 2019-11-29 MED ORDER — BLOOD PRESSURE MONITOR AUTOMAT DEVI: 1.0000 | Freq: Every day | 0 refills | Status: DC

## 2019-11-29 MED ORDER — GOJJI WEIGHT SCALE MISC: 1.0000 | Freq: Every day | 0 refills | Status: DC | PRN

## 2019-11-29 NOTE — Progress Notes (Signed)
INITIAL OBSTETRICAL VISIT Patient name: [name] MRN [id number]  Date of birth: 22-Apr-1991 Chief Complaint:   Initial Prenatal Visit  History of Present Illness:   [name] is a 29 y.o. G1P0 African American female at [redacted]w[redacted]d by LMP with an Estimated Date of Delivery: 06/22/20 being seen today for her initial obstetrical visit.  Her obstetrical history is significant for H/O (+) HSV. This is a planned pregnancy. She and the father of the baby (FOB) "Maisie Fus" live together. She has a support system that consists of the FOB, her family & friends. Today she reports nausea and vomiting. Requesting medication to help daily N/V.  Patient's last menstrual period was 08/13/2019. Last pap 03/16/2019. Results were: normal Review of Systems:   Pertinent items are noted in HPI Denies cramping/contractions, leakage of fluid, vaginal bleeding, abnormal vaginal discharge w/ itching/odor/irritation, headaches, visual changes, shortness of breath, chest pain, abdominal pain, severe nausea/vomiting, or problems with urination or bowel movements unless otherwise stated above.  Pertinent History Reviewed:  Reviewed past medical,surgical, social, obstetrical and family history.  Reviewed problem list, medications and allergies. OB History  Gravida Para Term Preterm AB Living  1            SAB TAB Ectopic Multiple Live Births               # Outcome Date GA Lbr Len/2nd Weight Sex Delivery Anes PTL Lv  1 Current            Physical Assessment:   Vitals:   11/19/19 1458 11/29/19 0915  BP:  119/77  Pulse:  99  Temp:  98 F (36.7 C)  Weight:  99 lb 12.8 oz (45.3 kg)  Height: 5' 1.5" (1.562 m)   Body mass index is 18.55 kg/m.       Physical Examination:  General appearance - well appearing, and in no distress  Mental status - alert, oriented to person, place, and time  Psych:  She has a normal mood and affect  Skin - warm and dry, normal color, no suspicious lesions noted  Chest - effort  normal, all lung fields clear to auscultation bilaterally  Heart - normal rate and regular rhythm  Abdomen - soft, nontender  Extremities:  No swelling or varicosities noted  Pelvic - VULVA: normal appearing vulva with no masses, tenderness or lesions  VAGINA: normal appearing vagina with normal color and discharge, no lesions.   CERVIX: normal appearing cervix without discharge or lesions, no CMT  Thin prep pap is not done    No results found for this or any previous visit (from the past 24 hour(s)).  Assessment & Plan:  1) Low-Risk Pregnancy G1P0 at [redacted]w[redacted]d with an Estimated Date of Delivery: 06/22/20   2) Initial OB visit - Welcomed to practice and introduced self to patient in addition to discussing other advanced practice providers that she may be seeing at this practice - Congratulated patient - Anticipatory guidance on upcoming appointments - Educated on COVID19 and pregnancy and the integration of virtual appointments  - Educated on babyscripts app- patient reports she has not received email, encouraged to look in spam folder and to call office if she still has not received email - patient verbalizes understanding   3) Supervision of normal first pregnancy, antepartum  - Cervicovaginal ancillary only( [place]),  - Obstetric Panel, Including HIV,  - Genetic Screening,  - Culture, OB Urine,  - Enroll Patient in Babyscripts,  - Hemoglobin A1c,  - Glucose,  -  Blood Pressure Monitoring (BLOOD PRESSURE MONITOR AUTOMAT) DEVI,  - Misc. 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 Blood Pressure Monitoring (BLOOD PRESSURE MONITOR AUTOMAT) DEVI    Sig: 1 Device by Does not apply route daily. Automatic blood pressure cuff regular size.  To monitor blood pressure regularly at home. ICD-10 code: O36.90    Dispense:  1 each    Refill:  0  . Misc. Devices (GOJJI WEIGHT SCALE) MISC    Sig: 1 Device by Does not apply route daily as needed. To weight self daily as needed at home. ICD-10 code: O46.90    Dispense:  1 each    Refill:  0  . promethazine (PHENERGAN) 12.5 MG tablet    Sig: Take 1 tablet (12.5 mg total) by mouth every 6 (six) hours as needed for nausea or vomiting.    Dispense:  30 tablet    Refill:  1    Order Specific Question:   Supervising Provider    Answer:   Donnamae Jude [[id number]]    Initial labs obtained Continue prenatal vitamins Reviewed n/v relief measures and warning s/s to report Reviewed recommended weight gain based on pre-gravid BMI Encouraged well-balanced diet Genetic Screening discussed: ordered Cystic fibrosis, SMA, Fragile X screening discussed ordered The nature of [place] with multiple MDs and other Advanced Practice Providers was explained to patient; also emphasized that residents, students are part of our team. Explained that desire to have midwives only can more than likely be honored by the way the L&D staffing schedules are formulated. Discussed optimized OB schedule and video visits. Advised can have an in-office visit whenever she feels she needs to be seen.  Does not have own BP cuff. BP cuff Rx faxed to [place] today. Explained to patient that BP will be mailed to her house. Check BP weekly, let us know if >140/90. Advised to call during normal business hours and there is an after-hours nurse line available.    Follow-up: Return in about 10 weeks (around 02/07/2020) for Return OB - My Chart video.   Orders Placed This Encounter  Procedures  . Culture, OB Urine  . Urine Culture, OB Reflex  . [place] MFM OB COMP + 14 WK  . Obstetric Panel, Including HIV  . Genetic Screening  . Hemoglobin A1c  . Glucose    Laury Deep MSN, [place] 11/29/2019

## 2019-11-30 ENCOUNTER — Encounter: Payer: Self-pay | Admitting: General Practice

## 2019-11-30 LAB — OBSTETRIC PANEL, INCLUDING HIV
Antibody Screen: NEGATIVE
Basophils Absolute: 0 10*3/uL (ref 0.0–0.2)
Basos: 1 %
EOS (ABSOLUTE): 0.1 10*3/uL (ref 0.0–0.4)
Eos: 1 %
HIV Screen 4th Generation wRfx: NONREACTIVE
Hematocrit: 35.9 % (ref 34.0–46.6)
Hemoglobin: 12.1 g/dL (ref 11.1–15.9)
Hepatitis B Surface Ag: NEGATIVE
Immature Grans (Abs): 0 10*3/uL (ref 0.0–0.1)
Immature Granulocytes: 0 %
Lymphocytes Absolute: 1.7 10*3/uL (ref 0.7–3.1)
Lymphs: 23 %
MCH: 31.3 pg (ref 26.6–33.0)
MCHC: 33.7 g/dL (ref 31.5–35.7)
MCV: 93 fL (ref 79–97)
Monocytes Absolute: 0.5 10*3/uL (ref 0.1–0.9)
Monocytes: 6 %
Neutrophils Absolute: 5.1 10*3/uL (ref 1.4–7.0)
Neutrophils: 69 %
Platelets: 318 10*3/uL (ref 150–450)
RBC: 3.86 x10E6/uL (ref 3.77–5.28)
RDW: 12.6 % (ref 11.7–15.4)
RPR Ser Ql: NONREACTIVE
Rh Factor: POSITIVE
Rubella Antibodies, IGG: 2.32 index (ref >0.99)
WBC: 7.3 10*3/uL (ref 3.4–10.8)

## 2019-11-30 LAB — CERVICOVAGINAL ANCILLARY ONLY
Bacterial Vaginitis (gardnerella): NEGATIVE
Candida Glabrata: NEGATIVE
Candida Vaginitis: POSITIVE — AB
Chlamydia: NEGATIVE
Comment: NEGATIVE
Comment: NEGATIVE
Comment: NEGATIVE
Comment: NEGATIVE
Comment: NEGATIVE
Comment: NORMAL
Neisseria Gonorrhea: NEGATIVE
Trichomonas: NEGATIVE

## 2019-11-30 LAB — GLUCOSE, RANDOM: Glucose: 88 mg/dL (ref 65–99)

## 2019-11-30 LAB — HEMOGLOBIN A1C
Est. average glucose Bld gHb Est-mCnc: 103 mg/dL
Hgb A1c MFr Bld: 5.2 % (ref 4.8–5.6)

## 2019-12-01 ENCOUNTER — Encounter: Payer: Self-pay | Admitting: Obstetrics and Gynecology

## 2019-12-01 LAB — URINE CULTURE, OB REFLEX

## 2019-12-01 LAB — CULTURE, OB URINE

## 2019-12-01 MED ORDER — PROMETHAZINE HCL 12.5 MG PO TABS: 12.5000 mg | ORAL_TABLET | Freq: Four times a day (QID) | ORAL | 1 refills | Status: DC | PRN

## 2019-12-03 ENCOUNTER — Telehealth: Payer: Self-pay | Admitting: *Deleted

## 2019-12-03 NOTE — Telephone Encounter (Signed)
Patient called to report episodes of dizziness, paleness, sweating and lightheadedness. Patient blood pressure reading from the pass two days 96/60 and 98/60. Today blood pressure 100/70 heart rate 72. Patient stated she is drinking about 4-5 bottles of water a day. Advised patient to increase water intake, make sure she is eating small meals/snacks throughout the day. When laying down to raise slowly. To stop a dizzy spell, lie down as soon as you start to feel lightheaded so you don't fall or pass out, then elevate your feet to increase blood flow to your brain. If patient symptoms worsen to go to MAU. Appointment for Mychart 12/05/2019 with Thressa Sheller, CNM to discuss symptoms.  Clovis Pu, RN

## 2019-12-05 ENCOUNTER — Telehealth (INDEPENDENT_AMBULATORY_CARE_PROVIDER_SITE_OTHER): Payer: Medicaid Other | Admitting: Advanced Practice Midwife

## 2019-12-05 ENCOUNTER — Other Ambulatory Visit: Payer: Self-pay

## 2019-12-05 DIAGNOSIS — Z3400 Encounter for supervision of normal first pregnancy, unspecified trimester: Secondary | ICD-10-CM

## 2019-12-05 DIAGNOSIS — Z3A11 11 weeks gestation of pregnancy: Secondary | ICD-10-CM | POA: Diagnosis not present

## 2019-12-05 DIAGNOSIS — R768 Other specified abnormal immunological findings in serum: Secondary | ICD-10-CM

## 2019-12-05 DIAGNOSIS — Z3401 Encounter for supervision of normal first pregnancy, first trimester: Secondary | ICD-10-CM

## 2019-12-05 MED ORDER — TERCONAZOLE 0.4 % VA CREA: 1.0000 | TOPICAL_CREAM | Freq: Every day | VAGINAL | 0 refills | Status: AC

## 2019-12-05 NOTE — Progress Notes (Signed)
   TELEHEALTH VIRTUAL OBSTETRICS VISIT ENCOUNTER NOTE  I connected with [name] on 12/05/19 at  4:10 PM EST by telephone at home and verified that I am speaking with the correct person using two identifiers.   I discussed the limitations, risks, security and privacy concerns of performing an evaluation and management service by telephone and the availability of in person appointments. I also discussed with the patient that there may be a patient responsible charge related to this service. The patient expressed understanding and agreed to proceed.  Subjective:  [name] is a 29 y.o. G1P0 at [redacted]w[redacted]d being followed for ongoing prenatal care.  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  Patient reports dizziness . Reports fetal movement. Denies any contractions, bleeding or leaking of fluid.   The following portions of the patient's history were reviewed and updated as appropriate: allergies, current medications, past family history, past medical history, past social history, past surgical history and problem list.   Objective:   General:  Alert, oriented and cooperative.   Mental Status: Normal mood and affect perceived. Normal judgment and thought content.  Rest of physical exam deferred due to type of encounter  Assessment and Plan:  Pregnancy: G1P0 at [redacted]w[redacted]d 1. Supervision of normal first pregnancy, antepartum - Patient had some dizziness over the weekend. She spoke with the nurse on Monday, and the nurse reviewed comfort measures. Patient reports today she is feeling better. I again reviewed ways to help with dizziness and reassured patient that this is normal for some in the early part of pregnancy. Questions answered.   Preterm labor symptoms and general obstetric precautions including but not limited to vaginal bleeding, contractions,  leaking of fluid and fetal movement were reviewed in detail with the patient.  I discussed the assessment and treatment plan with the patient. The patient was provided an opportunity to ask questions and all were answered. The patient agreed with the plan and demonstrated an understanding of the instructions. The patient was advised to call back or seek an in-person office evaluation/go to [facility]U at [facility] for any urgent or concerning symptoms. Please refer to After Visit Summary for other counseling recommendations.   I provided 29 minutes of non-face-to-face time during this encounter.  Return in about 4 weeks (around 01/02/2020) for virtual visit .  Future Appointments  Date Time Provider Department Center  01/28/2020 10:45 AM [facility] [place] 2 [facility] M[facility]-US    Thressa Sheller DNP, CNM  12/05/19  4:38 PM  [facility] for [employer], [facility]

## 2019-12-06 ENCOUNTER — Encounter: Payer: Self-pay | Admitting: General Practice

## 2019-12-07 ENCOUNTER — Encounter: Payer: Self-pay | Admitting: General Practice

## 2019-12-10 ENCOUNTER — Encounter: Payer: Self-pay | Admitting: General Practice

## 2019-12-22 ENCOUNTER — Other Ambulatory Visit: Payer: Self-pay

## 2019-12-22 ENCOUNTER — Encounter (HOSPITAL_COMMUNITY): Payer: Self-pay | Admitting: Emergency Medicine

## 2019-12-22 ENCOUNTER — Emergency Department (HOSPITAL_COMMUNITY)
Admission: AD | Admit: 2019-12-22 | Discharge: 2019-12-22 | Disposition: A | Discharge status: Home / Self Care | Payer: BC Managed Care – PPO | Attending: Obstetrics & Gynecology | Admitting: Obstetrics & Gynecology

## 2019-12-22 DIAGNOSIS — O219 Vomiting of pregnancy, unspecified: Secondary | ICD-10-CM | POA: Diagnosis not present

## 2019-12-22 DIAGNOSIS — Z228 Carrier of other infectious diseases: Secondary | ICD-10-CM | POA: Diagnosis not present

## 2019-12-22 DIAGNOSIS — B009 Herpesviral infection, unspecified: Secondary | ICD-10-CM | POA: Insufficient documentation

## 2019-12-22 DIAGNOSIS — O2691 Pregnancy related conditions, unspecified, first trimester: Secondary | ICD-10-CM | POA: Diagnosis not present

## 2019-12-22 DIAGNOSIS — O99351 Diseases of the nervous system complicating pregnancy, first trimester: Secondary | ICD-10-CM | POA: Diagnosis not present

## 2019-12-22 DIAGNOSIS — R768 Other specified abnormal immunological findings in serum: Secondary | ICD-10-CM

## 2019-12-22 DIAGNOSIS — Z3400 Encounter for supervision of normal first pregnancy, unspecified trimester: Secondary | ICD-10-CM

## 2019-12-22 DIAGNOSIS — O99830 Other infection carrier state complicating pregnancy: Secondary | ICD-10-CM | POA: Diagnosis not present

## 2019-12-22 DIAGNOSIS — G441 Vascular headache, not elsewhere classified: Secondary | ICD-10-CM

## 2019-12-22 DIAGNOSIS — R197 Diarrhea, unspecified: Secondary | ICD-10-CM | POA: Diagnosis not present

## 2019-12-22 DIAGNOSIS — Z79899 Other long term (current) drug therapy: Secondary | ICD-10-CM | POA: Diagnosis not present

## 2019-12-22 DIAGNOSIS — Z3A13 13 weeks gestation of pregnancy: Secondary | ICD-10-CM | POA: Diagnosis not present

## 2019-12-22 DIAGNOSIS — O26891 Other specified pregnancy related conditions, first trimester: Secondary | ICD-10-CM

## 2019-12-22 DIAGNOSIS — Z20822 Contact with and (suspected) exposure to covid-19: Secondary | ICD-10-CM | POA: Diagnosis not present

## 2019-12-22 DIAGNOSIS — F419 Anxiety disorder, unspecified: Secondary | ICD-10-CM | POA: Diagnosis not present

## 2019-12-22 DIAGNOSIS — O99341 Other mental disorders complicating pregnancy, first trimester: Secondary | ICD-10-CM | POA: Insufficient documentation

## 2019-12-22 DIAGNOSIS — O98511 Other viral diseases complicating pregnancy, first trimester: Secondary | ICD-10-CM | POA: Insufficient documentation

## 2019-12-22 HISTORY — DX: Herpesviral infection, unspecified: B00.9

## 2019-12-22 LAB — URINALYSIS, ROUTINE W REFLEX MICROSCOPIC
Bilirubin Urine: NEGATIVE
Glucose, UA: NEGATIVE mg/dL
Hgb urine dipstick: NEGATIVE
Ketones, ur: NEGATIVE mg/dL
Leukocytes,Ua: NEGATIVE
Nitrite: NEGATIVE
Protein, ur: NEGATIVE mg/dL
Specific Gravity, Urine: 1.009 (ref 1.005–1.030)
pH: 6 (ref 5.0–8.0)

## 2019-12-22 LAB — COMPREHENSIVE METABOLIC PANEL
ALT: 10 U/L (ref 0–44)
AST: 25 U/L (ref 15–41)
Albumin: 3.4 g/dL — ABNORMAL LOW (ref 3.5–5.0)
Alkaline Phosphatase: 38 U/L (ref 38–126)
Anion gap: 11 (ref 5–15)
BUN: 5 mg/dL — ABNORMAL LOW (ref 6–20)
CO2: 22 mmol/L (ref 22–32)
Calcium: 8.9 mg/dL (ref 8.9–10.3)
Chloride: 98 mmol/L (ref 98–111)
Creatinine, Ser: 0.61 mg/dL (ref 0.44–1.00)
GFR calc Af Amer: >60 mL/min (ref >60)
GFR calc non Af Amer: >60 mL/min (ref >60)
Glucose, Bld: 89 mg/dL (ref 70–99)
Potassium: 3.9 mmol/L (ref 3.5–5.1)
Sodium: 131 mmol/L — ABNORMAL LOW (ref 135–145)
Total Bilirubin: 0.3 mg/dL (ref 0.3–1.2)
Total Protein: 7.1 g/dL (ref 6.5–8.1)

## 2019-12-22 LAB — CBC
HCT: 36.4 % (ref 36.0–46.0)
Hemoglobin: 12.4 g/dL (ref 12.0–15.0)
MCH: 31.2 pg (ref 26.0–34.0)
MCHC: 34.1 g/dL (ref 30.0–36.0)
MCV: 91.5 fL (ref 80.0–100.0)
Platelets: 343 10*3/uL (ref 150–400)
RBC: 3.98 MIL/uL (ref 3.87–5.11)
RDW: 12.4 % (ref 11.5–15.5)
WBC: 6.5 10*3/uL (ref 4.0–10.5)
nRBC: 0 % (ref 0.0–0.2)

## 2019-12-22 LAB — SARS CORONAVIRUS 2 (TAT 6-24 HRS): SARS Coronavirus 2: NEGATIVE

## 2019-12-22 MED ORDER — LACTATED RINGERS IV SOLN: INTRAVENOUS | Status: DC

## 2019-12-22 MED ORDER — METOCLOPRAMIDE HCL 5 MG/ML IJ SOLN
10.0000 mg | Freq: Once | INTRAMUSCULAR | Status: AC
  Administered 2019-12-22: 16:00:00 10 mg via INTRAVENOUS
  Filled 2019-12-22: qty 2

## 2019-12-22 MED ORDER — DEXAMETHASONE SODIUM PHOSPHATE 10 MG/ML IJ SOLN
10.0000 mg | Freq: Once | INTRAMUSCULAR | Status: AC
  Administered 2019-12-22: 16:00:00 10 mg via INTRAVENOUS
  Filled 2019-12-22: qty 1

## 2019-12-22 MED ORDER — DIPHENHYDRAMINE HCL 50 MG/ML IJ SOLN
12.5000 mg | Freq: Once | INTRAMUSCULAR | Status: AC
  Administered 2019-12-22: 12.5 mg via INTRAVENOUS
  Filled 2019-12-22: qty 1

## 2019-12-22 MED ORDER — ONDANSETRON HCL 8 MG PO TABS: 8.0000 mg | ORAL_TABLET | Freq: Three times a day (TID) | ORAL | 0 refills | Status: DC | PRN

## 2019-12-22 NOTE — ED Provider Notes (Signed)
MSE was initiated and I personally evaluated the patient and placed orders (if any) at  1:52 PM on December 22, 2019.  This is a 29 year old female G1P0 presenting to the emergency department today with chief complaint of intermittent headache, vomiting, nausea, abdominal cramping x3 days.  Patient states she is [redacted]w[redacted]d and is established patient at [facility]. She does admit this feels like headaches she has had in the past and has progressively worsened.  Patient has been taking Advil for headache.  I discussed with patient template her medications are not safe in pregnancy and she should not take it any longer.  She denies any fever, chills, chest pain, shortness of breath, back pain.  PE: Constitutional: well-developed, well-nourished, no apparent distress HENT: normocephalic, atraumatic. no cervical adenopathy Cardiovascular: normal rate and rhythm, distal pulses intact Pulmonary/Chest: effort normal; breath sounds clear and equal bilaterally; no wheezes or rales Abdominal: soft and nontender Musculoskeletal: full ROM, no edema Neurological: alert with goal directed thinking Skin: warm and dry, no rash, no diaphoresis Psychiatric: normal mood and affect, normal behavior    Case discussed with MAU APP Samara Deist who agrees for transfer of care to MAU.   The patient appears stable so that the remainder of the MSE may be completed by another provider.   Kathyrn Lass 12/22/19 1356    Terald Sleeper, MD 12/22/19 [place]

## 2019-12-22 NOTE — MAU Note (Signed)
Patient unable to leave urine specimen at this time. Tolerating and drinking water during triage and initial nurse assessment.

## 2019-12-22 NOTE — ED Triage Notes (Signed)
Pt. Stated, Ive had som headache, vomiting and nausea for 3 days. Im also pregnant.

## 2019-12-22 NOTE — MAU Note (Signed)
[name] is a 29 y.o. at [redacted]w[redacted]d here in MAU reporting: for the past 3-4 days has had a headache, dizziness, and diarrhea. Has had 2-3 episodes of diarrhea since yesterday. Having intermittent n/v for the past few weeks. Some cramping. No bleeding or discharge.  Onset of complaint: past 3-4 days  Pain score: 2/10  Vitals:   12/22/19 1312 12/22/19 1424  BP: 112/75 117/75  Pulse: 99 94  Resp: 18 16  Temp: 98.1 F (36.7 C) 98.6 F (37 C)  SpO2: 99% 99%     Lab orders placed from triage: UA

## 2019-12-22 NOTE — Discharge Instructions (Signed)
-take immodium if you diarrhea continues,  -take up to 3 grams of tylenol in 24 hours if you develop headache -don't skip meals, drink water like crazy to prevent hydration, dizziness and headache.    Diarrhea, Adult Diarrhea is when you pass loose and watery poop (stool) often. Diarrhea can make you feel weak and cause you to lose water in your body (get dehydrated). Losing water in your body can cause you to:  Feel tired and thirsty.  Have a dry mouth.  Go pee (urinate) less often. Diarrhea often lasts 2-3 days. However, it can last longer if it is a sign of something more serious. It is important to treat your diarrhea as told by your doctor. Follow these instructions at home: Eating and drinking     Follow these instructions as told by your doctor:  Take an ORS (oral rehydration solution). This is a drink that helps you replace fluids and minerals your body lost. It is sold at pharmacies and stores.  Drink plenty of fluids, such as: ? Water. ? Ice chips. ? Diluted fruit juice. ? Low-calorie sports drinks. ? Milk, if you want.  Avoid drinking fluids that have a lot of sugar or caffeine in them.  Eat bland, easy-to-digest foods in small amounts as you are able. These foods include: ? Bananas. ? Applesauce. ? Rice. ? Low-fat (lean) meats. ? Toast. ? Crackers.  Avoid alcohol.  Avoid spicy or fatty foods.  Medicines  Take over-the-counter and prescription medicines only as told by your doctor.  If you were prescribed an antibiotic medicine, take it as told by your doctor. Do not stop using the antibiotic even if you start to feel better. General instructions   Wash your hands often using soap and water. If soap and water are not available, use a hand sanitizer. Others in your home should wash their hands as well. Hands should be washed: ? After using the toilet or changing a diaper. ? Before preparing, cooking, or serving food. ? While caring for a sick  person. ? While visiting someone in a hospital.  Drink enough fluid to keep your pee (urine) pale yellow.  Rest at home while you get better.  Watch your condition for any changes.  Take a warm bath to help with any burning or pain from having diarrhea.  Keep all follow-up visits as told by your doctor. This is important. Contact a doctor if:  You have a fever.  Your diarrhea gets worse.  You have new symptoms.  You cannot keep fluids down.  You feel light-headed or dizzy.  You have a headache.  You have muscle cramps. Get help right away if:  You have chest pain.  You feel very weak or you pass out (faint).  You have bloody or black poop or poop that looks like tar.  You have very bad pain, cramping, or bloating in your belly (abdomen).  You have trouble breathing or you are breathing very quickly.  Your heart is beating very quickly.  Your skin feels cold and clammy.  You feel confused.  You have signs of losing too much water in your body, such as: ? Dark pee, very little pee, or no pee. ? Cracked lips. ? Dry mouth. ? Sunken eyes. ? Sleepiness. ? Weakness. Summary  Diarrhea is when you pass loose and watery poop (stool) often.  Diarrhea can make you feel weak and cause you to lose water in your body (get dehydrated).  Take an ORS (oral  rehydration solution). This is a drink that is sold at pharmacies and stores.  Eat bland, easy-to-digest foods in small amounts as you are able.  Contact a doctor if your condition gets worse. Get help right away if you have signs that you have lost too much water in your body. This information is not intended to replace advice given to you by your health care provider. Make sure you discuss any questions you have with your health care provider. Document Revised: 02/24/2018 Document Reviewed: 02/24/2018 Elsevier Patient Education  2020 [employer].

## 2019-12-22 NOTE — MAU Provider Note (Addendum)
Patient Chirstine Kennedy is a 29 y.o. G1P0;  at [redacted]w[redacted]d here with complaints of headaches, dizziness with headaches, nausea and vomiting. She denies vaginal bleeding, vaginal discharge, abdominal pain. This has been going on for about 3 days, although the headache started first and the diarrhea started yesterday.   She denies fever, SOB, chest pain, cough. No loss of taste or smell.   She ate some bananas, yams and cookies and kept it all down.  Her mother states that patient had a "cardiac problem" when she was younger wherein patient would frequently faint without warning. Her mom wants to know if patient's dizziness could be due to that same condition. Patient has not had syncopal episode in over 10 years.  History     CSN: [id number]  Arrival date and time: 12/22/19 1414   First Provider Initiated Contact with Patient 12/22/19 1455      Chief Complaint  Patient presents with  . Headache  . Nausea  . Emesis   Headache  This is a new problem. The current episode started in the past 7 days. The pain is located in the bilateral and frontal region. Quality: pounding. The pain is at a severity of 5/10 (5/6 today but yesterday it was a 10/10). Associated symptoms include dizziness and vomiting. Pertinent negatives include no abdominal pain, blurred vision, coughing, fever or seizures. She has tried NSAIDs for the symptoms. The treatment provided no relief. There is no history of hypertension or migraine headaches.  Emesis  Associated symptoms include diarrhea, dizziness and headaches. Pertinent negatives include no abdominal pain, coughing or fever.  Diarrhea  This is a new problem. Episode onset: two days ago. The problem occurs 2 to 4 times per day. The problem has been gradually improving. Associated symptoms include headaches and vomiting. Pertinent negatives include no abdominal pain, coughing or fever.    OB History    Gravida  1   Para      Term      Preterm      AB       Living        SAB      TAB      Ectopic      Multiple      Live Births              Past Medical History:  Diagnosis Date  . Anxiety   . Costochondritis   . HSV-2 infection     Past Surgical History:  Procedure Laterality Date  . COLPOSCOPY     Dr. Dareen Piano at [facility] 1.5-2 years ago    Family History  Problem Relation Age of Onset  . Ovarian cancer Paternal Aunt   . Diabetes Maternal Grandmother     Social History   Tobacco Use  . Smoking status: Never Smoker  . Smokeless tobacco: Never Used  Substance Use Topics  . Alcohol use: Not Currently  . Drug use: No    Allergies: No Known Allergies  Medications Prior to Admission  Medication Sig Dispense Refill Last Dose  . Blood Pressure Monitoring (BLOOD PRESSURE MONITOR AUTOMAT) DEVI 1 Device by Does not apply route daily. Automatic blood pressure cuff regular size. To monitor blood pressure regularly at home. ICD-10 code: O09.90 1 each 0   . cephALEXin (KEFLEX) 500 MG capsule Take 1 capsule (500 mg total) by mouth 4 (four) times daily. (Patient not taking: Reported on 11/29/2019) 20 capsule 0   . citalopram (CELEXA) 20 MG tablet Take 20  mg by mouth daily.     . diazepam (VALIUM) 5 MG tablet Take 1 tablet (5 mg total) by mouth every 8 (eight) hours as needed for anxiety (spasms). (Patient not taking: Reported on 11/29/2019) 20 tablet 0   . ibuprofen (ADVIL,MOTRIN) 800 MG tablet Take 800 mg by mouth every 8 (eight) hours as needed for moderate pain.     . Misc. Devices (GOJJI WEIGHT SCALE) MISC 1 Device by Does not apply route daily as needed. To weight self daily as needed at home. ICD-10 code: O09.90 1 each 0   . ondansetron (ZOFRAN ODT) 4 MG disintegrating tablet Take 1 tablet (4 mg total) by mouth every 8 (eight) hours as needed for nausea or vomiting. (Patient not taking: Reported on 11/29/2019) 15 tablet 0   . ondansetron (ZOFRAN) 4 MG tablet Take 4 mg by mouth every 8 (eight) hours as needed for nausea or  vomiting.     . promethazine (PHENERGAN) 12.5 MG tablet Take 1 tablet (12.5 mg total) by mouth every 6 (six) hours as needed for nausea or vomiting. 30 tablet 1     Review of Systems  Constitutional: Negative for fever.  Eyes: Negative for blurred vision.  Respiratory: Negative for cough.   Gastrointestinal: Positive for diarrhea and vomiting. Negative for abdominal pain.  Neurological: Positive for dizziness and headaches. Negative for seizures.   Physical Exam   Blood pressure 117/75, pulse 94, temperature 98.6 F (37 C), temperature source Oral, resp. rate 16, height 5\' 1"  (1.549 m), weight 45.2 kg, last menstrual period 08/13/2019, SpO2 99 %.  Physical Exam  Constitutional: She is oriented to person, place, and time. She appears well-developed.  HENT:  Head: Normocephalic.  Respiratory: Effort normal.  GI: Soft.  Musculoskeletal:        General: Normal range of motion.     Cervical back: Normal range of motion.  Neurological: She is alert and oriented to person, place, and time.  Skin: Skin is warm and dry.  Psychiatric: She has a normal mood and affect.    MAU Course  Procedures  MDM Patient had 1 L of LR and Headache cocktail; now feels better and ate chicken nuggets.  No vomiting while in MAU, only one episode of diarrha.  -will check for Covid-19 as patient symptom's may be indicative of Covid-19 infection (headache, diarrhea).  CBC: Normal CMP: Normal  -UA normal, no signs of dehydration.  Assessment and Plan   1. Other vascular headache   2. Supervision of normal first pregnancy, antepartum   3. HSV-2 seropositive    - Patient stable for discharge with recommendation to take Zofran for nausea, continue to quarantine at home until Covid test results are available. Keep checking MyChart for Covid results; I will notify her tonight or tomorrow morning when they are available.    -If patient has a syncopal episode or mother observes patient losing consciousness,  patient to go to main ED. May consider cardiology referral in the future.  -Take immodium for diarrhea; go to [facility] if she is not better in 48 hours.   Mervyn Skeeters Dionicio Shelnutt 12/22/2019, 2:59 PM

## 2020-01-03 ENCOUNTER — Encounter: Payer: Self-pay | Admitting: Obstetrics and Gynecology

## 2020-01-03 ENCOUNTER — Telehealth (INDEPENDENT_AMBULATORY_CARE_PROVIDER_SITE_OTHER): Payer: Medicaid Other | Admitting: Obstetrics and Gynecology

## 2020-01-03 DIAGNOSIS — Z3400 Encounter for supervision of normal first pregnancy, unspecified trimester: Secondary | ICD-10-CM

## 2020-01-03 DIAGNOSIS — Z3A15 15 weeks gestation of pregnancy: Secondary | ICD-10-CM

## 2020-01-03 DIAGNOSIS — Z3402 Encounter for supervision of normal first pregnancy, second trimester: Secondary | ICD-10-CM

## 2020-01-03 NOTE — Patient Instructions (Signed)
Second Trimester of Pregnancy The second trimester is from week 14 through week 27 (months 4 through 6). The second trimester is often a time when you feel your best. Your body has adjusted to being pregnant, and you begin to feel better physically. Usually, morning sickness has lessened or quit completely, you may have more energy, and you may have an increase in appetite. The second trimester is also a time when the fetus is growing rapidly. At the end of the sixth month, the fetus is about 9 inches long and weighs about 1 pounds. You will likely begin to feel the baby move (quickening) between 16 and 20 weeks of pregnancy. Body changes during your second trimester Your body continues to go through many changes during your second trimester. The changes vary from woman to woman.  Your weight will continue to increase. You will notice your lower abdomen bulging out.  You may begin to get stretch marks on your hips, abdomen, and breasts.  You may develop headaches that can be relieved by medicines. The medicines should be approved by your health care provider.  You may urinate more often because the fetus is pressing on your bladder.  You may develop or continue to have heartburn as a result of your pregnancy.  You may develop constipation because certain hormones are causing the muscles that push waste through your intestines to slow down.  You may develop hemorrhoids or swollen, bulging veins (varicose veins).  You may have back pain. This is caused by: ? Weight gain. ? Pregnancy hormones that are relaxing the joints in your pelvis. ? A shift in weight and the muscles that support your balance.  Your breasts will continue to grow and they will continue to become tender.  Your gums may bleed and may be sensitive to brushing and flossing.  Dark spots or blotches (chloasma, mask of pregnancy) may develop on your face. This will likely fade after the baby is born.  A dark line from your  belly button to the pubic area (linea nigra) may appear. This will likely fade after the baby is born.  You may have changes in your hair. These can include thickening of your hair, rapid growth, and changes in texture. Some women also have hair loss during or after pregnancy, or hair that feels dry or thin. Your hair will most likely return to normal after your baby is born. What to expect at prenatal visits During a routine prenatal visit:  You will be weighed to make sure you and the fetus are growing normally.  Your blood pressure will be taken.  Your abdomen will be measured to track your baby's growth.  The fetal heartbeat will be listened to.  Any test results from the previous visit will be discussed. Your health care provider may ask you:  How you are feeling.  If you are feeling the baby move.  If you have had any abnormal symptoms, such as leaking fluid, bleeding, severe headaches, or abdominal cramping.  If you are using any tobacco products, including cigarettes, chewing tobacco, and electronic cigarettes.  If you have any questions. Other tests that may be performed during your second trimester include:  Blood tests that check for: ? Low iron levels (anemia). ? High blood sugar that affects pregnant women (gestational diabetes) between 24 and 28 weeks. ? Rh antibodies. This is to check for a protein on red blood cells (Rh factor).  Urine tests to check for infections, diabetes, or protein in the   urine.  An ultrasound to confirm the proper growth and development of the baby.  An amniocentesis to check for possible genetic problems.  Fetal screens for spina bifida and Down syndrome.  HIV (human immunodeficiency virus) testing. Routine prenatal testing includes screening for HIV, unless you choose not to have this test. Follow these instructions at home: Medicines  Follow your health care provider's instructions regarding medicine use. Specific medicines may be  either safe or unsafe to take during pregnancy.  Take a prenatal vitamin that contains at least 600 micrograms (mcg) of folic acid.  If you develop constipation, try taking a stool softener if your health care provider approves. Eating and drinking   Eat a balanced diet that includes fresh fruits and vegetables, whole grains, good sources of protein such as meat, eggs, or tofu, and low-fat dairy. Your health care provider will help you determine the amount of weight gain that is right for you.  Avoid raw meat and uncooked cheese. These carry germs that can cause birth defects in the baby.  If you have low calcium intake from food, talk to your health care provider about whether you should take a daily calcium supplement.  Limit foods that are high in fat and processed sugars, such as fried and sweet foods.  To prevent constipation: ? Drink enough fluid to keep your urine clear or pale yellow. ? Eat foods that are high in fiber, such as fresh fruits and vegetables, whole grains, and beans. Activity  Exercise only as directed by your health care provider. Most women can continue their usual exercise routine during pregnancy. Try to exercise for 30 minutes at least 5 days a week. Stop exercising if you experience uterine contractions.  Avoid heavy lifting, wear low heel shoes, and practice good posture.  A sexual relationship may be continued unless your health care provider directs you otherwise. Relieving pain and discomfort  Wear a good support bra to prevent discomfort from breast tenderness.  Take warm sitz baths to soothe any pain or discomfort caused by hemorrhoids. Use hemorrhoid cream if your health care provider approves.  Rest with your legs elevated if you have leg cramps or low back pain.  If you develop varicose veins, wear support hose. Elevate your feet for 15 minutes, 3-4 times a day. Limit salt in your diet. Prenatal Care  Write down your questions. Take them to  your prenatal visits.  Keep all your prenatal visits as told by your health care provider. This is important. Safety  Wear your seat belt at all times when driving.  Make a list of emergency phone numbers, including numbers for family, friends, the hospital, and police and fire departments. General instructions  Ask your health care provider for a referral to a local prenatal education class. Begin classes no later than the beginning of month 6 of your pregnancy.  Ask for help if you have counseling or nutritional needs during pregnancy. Your health care provider can offer advice or refer you to specialists for help with various needs.  Do not use hot tubs, steam rooms, or saunas.  Do not douche or use tampons or scented sanitary pads.  Do not cross your legs for long periods of time.  Avoid cat litter boxes and soil used by cats. These carry germs that can cause birth defects in the baby and possibly loss of the fetus by miscarriage or stillbirth.  Avoid all smoking, herbs, alcohol, and unprescribed drugs. Chemicals in these products can affect the formation   and growth of the baby.  Do not use any products that contain nicotine or tobacco, such as cigarettes and e-cigarettes. If you need help quitting, ask your health care provider.  Visit your dentist if you have not gone yet during your pregnancy. Use a soft toothbrush to brush your teeth and be gentle when you floss. Contact a health care provider if:  You have dizziness.  You have mild pelvic cramps, pelvic pressure, or nagging pain in the abdominal area.  You have persistent nausea, vomiting, or diarrhea.  You have a bad smelling vaginal discharge.  You have pain when you urinate. Get help right away if:  You have a fever.  You are leaking fluid from your vagina.  You have spotting or bleeding from your vagina.  You have severe abdominal cramping or pain.  You have rapid weight gain or weight loss.  You have  shortness of breath with chest pain.  You notice sudden or extreme swelling of your face, hands, ankles, feet, or legs.  You have not felt your baby move in over an hour.  You have severe headaches that do not go away when you take medicine.  You have vision changes. Summary  The second trimester is from week 14 through week 27 (months 4 through 6). It is also a time when the fetus is growing rapidly.  Your body goes through many changes during pregnancy. The changes vary from woman to woman.  Avoid all smoking, herbs, alcohol, and unprescribed drugs. These chemicals affect the formation and growth your baby.  Do not use any tobacco products, such as cigarettes, chewing tobacco, and e-cigarettes. If you need help quitting, ask your health care provider.  Contact your health care provider if you have any questions. Keep all prenatal visits as told by your health care provider. This is important. This information is not intended to replace advice given to you by your health care provider. Make sure you discuss any questions you have with your health care provider. Document Revised: 01/12/2019 Document Reviewed: 10/26/2016 Elsevier Patient Education  [place] Test Why am I having this test? The alpha-fetoprotein test is most commonly used in pregnant women to help screen for birth defects in their unborn baby. It can be used to screen for birth defects, such as chromosome (DNA) abnormalities, problems with the brain or spinal cord, or problems with the abdominal wall of the unborn baby (fetus). The alpha-fetoprotein test may also be done for men or non-pregnant women to check for certain cancers. What is being tested? This test measures the amount of alpha-fetoprotein (AFP) in your blood. AFP is a protein that is made by the liver. Levels can be detected in the mother's blood during pregnancy, starting at 10 weeks and peaking at 16-18 weeks of the pregnancy.  Abnormal levels can sometimes be a sign of a birth defect in the baby. Certain cancers can cause a high level of AFP in men and non-pregnant women. What kind of sample is taken?  A blood sample is required for this test. It is usually collected by inserting a needle into a blood vessel. How are the results reported? Your test results will be reported as values. Your health care provider will compare your results to normal ranges that were established after testing a large group of people (reference values). Reference values may vary among labs and hospitals. For this test, common reference values are:  Adult: Less than 40 ng/mL or less than 40 mcg/L (  SI units).  Child younger than 1 year: Less than 30 ng/mL. If you are pregnant, the values may also vary based on how long you have been pregnant. What do the results mean? Results that are above the reference values in pregnant women may indicate the following for the baby:  Neural tube defects, such as abnormalities of the spinal cord or brain.  Abdominal wall defects.  Multiple pregnancy such as twins.  Fetal distress or fetal death. Results that are above the reference values in men or non-pregnant women may indicate:  Reproductive cancers, such as ovarian or testicular cancer.  Liver cancer.  Liver cell death.  Other types of cancer. Very low levels of AFP in pregnant women may indicate the following for the baby:  Down syndrome.  Fetal death. Talk with your health care provider about what your results mean. Questions to ask your health care provider Ask your health care provider, or the department that is doing the test:  When will my results be ready?  How will I get my results?  What are my treatment options?  What other tests do I need?  What are my next steps? Summary  The alpha-fetoprotein test is done on pregnant women to help screen for birth defects in their unborn baby.  Certain cancers can cause a high  level of AFP in men and non-pregnant women.  For this test, a blood sample is usually collected by inserting a needle into a blood vessel.  Talk with your health care provider about what your results mean. This information is not intended to replace advice given to you by your health care provider. Make sure you discuss any questions you have with your health care provider. Document Revised: 09/02/2017 Document Reviewed: 04/26/2017 Elsevier Patient Education  2020 [employer].

## 2020-01-03 NOTE — Progress Notes (Addendum)
MY CHART VIDEO VIRTUAL OBSTETRICS VISIT ENCOUNTER NOTE  I connected with [name] on 01/03/20 at  1:30 PM EDT by My Chart video at home and verified that I am speaking with the correct person using two identifiers.   I discussed the limitations, risks, security and privacy concerns of performing an evaluation and management service by My Chart video and the availability of in person appointments. I also discussed with the patient that there may be a patient responsible charge related to this service. The patient expressed understanding and agreed to proceed.  Subjective:  [name] is a 29 y.o. G1P0 at [redacted]w[redacted]d being followed for ongoing prenatal care.  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  Patient reports headache and mild occ. cramping that mostly occurs at nighttime, improves with drinking water, rest. Admits to not taking "too much Tylenol, because she is not sure how much she can take and if taking it is safe." Reports fetal movement. Denies any contractions, bleeding or leaking of fluid.   The following portions of the patient's history were reviewed and updated as appropriate: allergies, current medications, past family history, past medical history, past social history, past surgical history and problem list.   Objective:   General:  Alert, oriented and cooperative.   Mental Status: Normal mood and affect perceived. Normal judgment and thought content.  Rest of physical exam deferred due to type of encounter  BP 114/71   Pulse 100   Wt 100 lb 9.6 oz (45.6 kg)   LMP 08/13/2019   BMI 19.01 kg/m  **Done by patient's own at home BP cuff and scale  Assessment and Plan:  Pregnancy: G1P0 at [redacted]w[redacted]d  1. Supervision of normal first pregnancy, antepartum - Advised that some cramping is normal as her uterus enlarges - Discussed  reasons cramping can not be normal and when to seek care at MAU - Continue drinking a gallon+ water daily  - Advised can take Tylenol 1000 mg every 8 hrs prn. - Advised if taking Tylenol more frequently for 7-10 days and no relief from H/A's to call office or send a My Chart message to request Rx medication for H/As (Flexeril) - Discussed upcoming 2nd trimester genetic screening and anatomy U/S - Anticipatory guidance for next OB visit via My Chart  Preterm labor symptoms and general obstetric precautions including but not limited to vaginal bleeding, contractions, leaking of fluid and fetal movement were reviewed in detail with the patient.  I discussed the assessment and treatment plan with the patient. The patient was provided an opportunity to ask questions and all were answered. The patient agreed with the plan and demonstrated an understanding of the instructions. The patient was advised to call back or seek an in-person office evaluation/go to MAU at [facility] for any urgent or concerning symptoms. Please refer to After Visit Summary for other counseling recommendations.   I provided 10 minutes of non-face-to-face time during this encounter. There was 5 minutes of chart review time spent prior to this encounter. Total time spent = 15 minutes.  Return in about 4 weeks (around 01/31/2020) for Return OB - My Chart video.  Future Appointments  Date Time Provider Department Center  01/28/2020 10:00 AM WOC-WOCA LAB WOC-WOCA WOC  01/28/2020 10:45 AM WH-MFC [place] 2 WH-MFCUS MFC-US    Raelyn Mora, CNM Center for [employer], [facility]

## 2020-01-08 ENCOUNTER — Other Ambulatory Visit: Payer: Self-pay | Admitting: Obstetrics and Gynecology

## 2020-01-08 DIAGNOSIS — O219 Vomiting of pregnancy, unspecified: Secondary | ICD-10-CM

## 2020-01-08 DIAGNOSIS — O26892 Other specified pregnancy related conditions, second trimester: Secondary | ICD-10-CM

## 2020-01-08 DIAGNOSIS — R519 Headache, unspecified: Secondary | ICD-10-CM

## 2020-01-08 MED ORDER — CYCLOBENZAPRINE HCL 10 MG PO TABS: 10.0000 mg | ORAL_TABLET | Freq: Three times a day (TID) | ORAL | 1 refills | Status: DC | PRN

## 2020-01-08 MED ORDER — ONDANSETRON 8 MG PO TBDP: 8.0000 mg | ORAL_TABLET | Freq: Three times a day (TID) | ORAL | 0 refills | Status: DC | PRN

## 2020-01-08 NOTE — Progress Notes (Signed)
Rx for Flexeril sent to pharmacy per pt request. Also Zofran sent for N/V.

## 2020-01-28 ENCOUNTER — Other Ambulatory Visit: Payer: Self-pay

## 2020-01-28 ENCOUNTER — Ambulatory Visit (HOSPITAL_COMMUNITY)
Admission: RE | Admit: 2020-01-28 | Discharge: 2020-01-28 | Disposition: A | Discharge status: Home / Self Care | Payer: Medicaid Other | Source: Ambulatory Visit | Attending: Obstetrics and Gynecology | Admitting: Obstetrics and Gynecology

## 2020-01-28 ENCOUNTER — Other Ambulatory Visit: Payer: Medicaid Other

## 2020-01-28 DIAGNOSIS — Z3400 Encounter for supervision of normal first pregnancy, unspecified trimester: Secondary | ICD-10-CM

## 2020-01-28 DIAGNOSIS — Z363 Encounter for antenatal screening for malformations: Secondary | ICD-10-CM

## 2020-01-28 DIAGNOSIS — Z3A19 19 weeks gestation of pregnancy: Secondary | ICD-10-CM | POA: Diagnosis not present

## 2020-01-30 LAB — AFP, SERUM, OPEN SPINA BIFIDA
AFP MoM: 1.64
AFP Value: 115.9 ng/mL
Gest. Age on Collection Date: 19.1 weeks
Maternal Age At EDD: 28.8 yr
OSBR Risk 1 IN: 3791
Test Results:: NEGATIVE
Weight: 109 [lb_av]

## 2020-02-21 ENCOUNTER — Telehealth (INDEPENDENT_AMBULATORY_CARE_PROVIDER_SITE_OTHER): Payer: Medicaid Other | Admitting: Obstetrics and Gynecology

## 2020-02-21 ENCOUNTER — Encounter: Payer: Self-pay | Admitting: Obstetrics and Gynecology

## 2020-02-21 DIAGNOSIS — Z3400 Encounter for supervision of normal first pregnancy, unspecified trimester: Secondary | ICD-10-CM

## 2020-02-21 DIAGNOSIS — Z3402 Encounter for supervision of normal first pregnancy, second trimester: Secondary | ICD-10-CM

## 2020-02-21 DIAGNOSIS — R768 Other specified abnormal immunological findings in serum: Secondary | ICD-10-CM

## 2020-02-21 DIAGNOSIS — Z3A22 22 weeks gestation of pregnancy: Secondary | ICD-10-CM

## 2020-02-21 NOTE — Patient Instructions (Signed)
Glucose Tolerance Test During Pregnancy Why am I having this test? The glucose tolerance test (GTT) is done to check how your body processes sugar (glucose). This is one of several tests used to diagnose diabetes that develops during pregnancy (gestational diabetes mellitus). Gestational diabetes is a temporary form of diabetes that some women develop during pregnancy. It usually occurs during the second trimester of pregnancy and goes away after delivery. Testing (screening) for gestational diabetes usually occurs between 24 and 28 weeks of pregnancy. You may have the GTT test after having a 1-hour glucose screening test if the results from that test indicate that you may have gestational diabetes. You may also have this test if:  You have a history of gestational diabetes.  You have a history of giving birth to very large babies or have experienced repeated fetal loss (stillbirth).  You have signs and symptoms of diabetes, such as: ? Changes in your vision. ? Tingling or numbness in your hands or feet. ? Changes in hunger, thirst, and urination that are not otherwise explained by your pregnancy. What is being tested? This test measures the amount of glucose in your blood at different times during a period of 3 hours. This indicates how well your body is able to process glucose. What kind of sample is taken?  Blood samples are required for this test. They are usually collected by inserting a needle into a blood vessel. How do I prepare for this test?  For 3 days before your test, eat normally. Have plenty of carbohydrate-rich foods.  Follow instructions from your health care provider about: ? Eating or drinking restrictions on the day of the test. You may be asked to not eat or drink anything other than water (fast) starting 8-10 hours before the test. ? Changing or stopping your regular medicines. Some medicines may interfere with this test. Tell a health care provider about:  All  medicines you are taking, including vitamins, herbs, eye drops, creams, and over-the-counter medicines.  Any blood disorders you have.  Any surgeries you have had.  Any medical conditions you have. What happens during the test? First, your blood glucose will be measured. This is referred to as your fasting blood glucose, since you fasted before the test. Then, you will drink a glucose solution that contains a certain amount of glucose. Your blood glucose will be measured again 1, 2, and 3 hours after drinking the solution. This test takes about 3 hours to complete. You will need to stay at the testing location during this time. During the testing period:  Do not eat or drink anything other than the glucose solution.  Do not exercise.  Do not use any products that contain nicotine or tobacco, such as cigarettes and e-cigarettes. If you need help stopping, ask your health care provider. The testing procedure may vary among health care providers and hospitals. How are the results reported? Your results will be reported as milligrams of glucose per deciliter of blood (mg/dL) or millimoles per liter (mmol/L). Your health care provider will compare your results to normal ranges that were established after testing a large group of people (reference ranges). Reference ranges may vary among labs and hospitals. For this test, common reference ranges are:  Fasting: less than 95-105 mg/dL (5.3-5.8 mmol/L).  1 hour after drinking glucose: less than 180-190 mg/dL (10.0-10.5 mmol/L).  2 hours after drinking glucose: less than 155-165 mg/dL (8.6-9.2 mmol/L).  3 hours after drinking glucose: 140-145 mg/dL (7.8-8.1 mmol/L). What do the   results mean? Results within reference ranges are considered normal, meaning that your glucose levels are well-controlled. If two or more of your blood glucose levels are high, you may be diagnosed with gestational diabetes. If only one level is high, your health care  provider may suggest repeat testing or other tests to confirm a diagnosis. Talk with your health care provider about what your results mean. Questions to ask your health care provider Ask your health care provider, or the department that is doing the test:  When will my results be ready?  How will I get my results?  What are my treatment options?  What other tests do I need?  What are my next steps? Summary  The glucose tolerance test (GTT) is one of several tests used to diagnose diabetes that develops during pregnancy (gestational diabetes mellitus). Gestational diabetes is a temporary form of diabetes that some women develop during pregnancy.  You may have the GTT test after having a 1-hour glucose screening test if the results from that test indicate that you may have gestational diabetes. You may also have this test if you have any symptoms or risk factors for gestational diabetes.  Talk with your health care provider about what your results mean. This information is not intended to replace advice given to you by your health care provider. Make sure you discuss any questions you have with your health care provider. Document Revised: 01/11/2019 Document Reviewed: 05/02/2017 Elsevier Patient Education  2020 Elsevier Inc.  

## 2020-02-21 NOTE — Progress Notes (Signed)
   MY CHART VIDEO VIRTUAL OBSTETRICS VISIT ENCOUNTER NOTE  I connected with [name] on 02/21/20 at  3:10 PM EDT by My Chart video at home and verified that I am speaking with the correct person using two identifiers. Provider located at [employer] for [employer] at [place].   I discussed the limitations, risks, security and privacy concerns of performing an evaluation and management service by My Chart video and the availability of in person appointments. I also discussed with the patient that there may be a patient responsible charge related to this service. The patient expressed understanding and agreed to proceed.  Subjective:  [name] is a 29 y.o. G1P0 at [redacted]w[redacted]d being followed for ongoing prenatal care.  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  Patient reports no complaints. Reports fetal movement. Denies any contractions, bleeding or leaking of fluid.   The following portions of the patient's history were reviewed and updated as appropriate: allergies, current medications, past family history, past medical history, past social history, past surgical history and problem list.   Objective:   General:  Alert, oriented and cooperative.   Mental Status: Normal mood and affect perceived. Normal judgment and thought content.  Rest of physical exam deferred due to type of encounter  BP 135/76   Pulse 100   Wt 112 lb 9.6 oz (51.1 kg)   LMP 08/13/2019   BMI 21.28 kg/m  **Done by patient's own at home BP cuff and scale  Assessment and Plan:  Pregnancy: G1P0 at [redacted]w[redacted]d  1. Supervision of normal first pregnancy, antepartum - Anticipatory guidance for 2 hr GTT and third trimester labs - Discussed natural childbirth options - Patient considering encapsulating her placenta -- will research someone to do it for her   Preterm labor  symptoms and general obstetric precautions including but not limited to vaginal bleeding, contractions, leaking of fluid and fetal movement were reviewed in detail with the patient.  I discussed the assessment and treatment plan with the patient. The patient was provided an opportunity to ask questions and all were answered. The patient agreed with the plan and demonstrated an understanding of the instructions. The patient was advised to call back or seek an in-person office evaluation/go to MAU at [facility] for any urgent or concerning symptoms. Please refer to After Visit Summary for other counseling recommendations.   I provided 10 minutes of non-face-to-face time during this encounter. There was 5 minutes of chart review time spent prior to this encounter. Total time spent = 15 minutes.  Return in about 5 weeks (around 03/27/2020) for Return OB 2hr GTT.  Future Appointments  Date Time Provider Department Center  03/21/2020  8:10 AM Gerrit Heck, CNM [facility] None    Raelyn Mora, CNM Center for [employer], [facility]

## 2020-03-05 ENCOUNTER — Telehealth: Payer: Self-pay | Admitting: *Deleted

## 2020-03-05 ENCOUNTER — Inpatient Hospital Stay (HOSPITAL_COMMUNITY)
Admission: AD | Admit: 2020-03-05 | Discharge: 2020-03-05 | Disposition: A | Discharge status: Home / Self Care | Payer: BC Managed Care – PPO | Attending: Obstetrics and Gynecology | Admitting: Obstetrics and Gynecology

## 2020-03-05 ENCOUNTER — Encounter (HOSPITAL_COMMUNITY): Payer: Self-pay | Admitting: Obstetrics and Gynecology

## 2020-03-05 ENCOUNTER — Other Ambulatory Visit: Payer: Self-pay

## 2020-03-05 DIAGNOSIS — O99012 Anemia complicating pregnancy, second trimester: Secondary | ICD-10-CM | POA: Insufficient documentation

## 2020-03-05 DIAGNOSIS — Z3A24 24 weeks gestation of pregnancy: Secondary | ICD-10-CM | POA: Diagnosis not present

## 2020-03-05 DIAGNOSIS — D649 Anemia, unspecified: Secondary | ICD-10-CM | POA: Diagnosis not present

## 2020-03-05 DIAGNOSIS — O26892 Other specified pregnancy related conditions, second trimester: Secondary | ICD-10-CM | POA: Diagnosis present

## 2020-03-05 DIAGNOSIS — O99891 Other specified diseases and conditions complicating pregnancy: Secondary | ICD-10-CM | POA: Diagnosis not present

## 2020-03-05 DIAGNOSIS — R55 Syncope and collapse: Secondary | ICD-10-CM | POA: Diagnosis not present

## 2020-03-05 DIAGNOSIS — R42 Dizziness and giddiness: Secondary | ICD-10-CM | POA: Insufficient documentation

## 2020-03-05 LAB — CBC
HCT: 30.2 % — ABNORMAL LOW (ref 36.0–46.0)
Hemoglobin: 10.1 g/dL — ABNORMAL LOW (ref 12.0–15.0)
MCH: 31.4 pg (ref 26.0–34.0)
MCHC: 33.4 g/dL (ref 30.0–36.0)
MCV: 93.8 fL (ref 80.0–100.0)
Platelets: 294 10*3/uL (ref 150–400)
RBC: 3.22 MIL/uL — ABNORMAL LOW (ref 3.87–5.11)
RDW: 13.2 % (ref 11.5–15.5)
WBC: 10.3 10*3/uL (ref 4.0–10.5)
nRBC: 0 % (ref 0.0–0.2)

## 2020-03-05 LAB — URINALYSIS, ROUTINE W REFLEX MICROSCOPIC
Bilirubin Urine: NEGATIVE
Glucose, UA: NEGATIVE mg/dL
Hgb urine dipstick: NEGATIVE
Ketones, ur: NEGATIVE mg/dL
Leukocytes,Ua: NEGATIVE
Nitrite: NEGATIVE
Protein, ur: NEGATIVE mg/dL
Specific Gravity, Urine: 1.015 (ref 1.005–1.030)
pH: 6 (ref 5.0–8.0)

## 2020-03-05 LAB — COMPREHENSIVE METABOLIC PANEL
ALT: 25 U/L (ref 0–44)
AST: 26 U/L (ref 15–41)
Albumin: 2.9 g/dL — ABNORMAL LOW (ref 3.5–5.0)
Alkaline Phosphatase: 58 U/L (ref 38–126)
Anion gap: 10 (ref 5–15)
BUN: 8 mg/dL (ref 6–20)
CO2: 22 mmol/L (ref 22–32)
Calcium: 8.5 mg/dL — ABNORMAL LOW (ref 8.9–10.3)
Chloride: 103 mmol/L (ref 98–111)
Creatinine, Ser: 0.61 mg/dL (ref 0.44–1.00)
GFR calc Af Amer: >60 mL/min (ref >60)
GFR calc non Af Amer: >60 mL/min (ref >60)
Glucose, Bld: 82 mg/dL (ref 70–99)
Potassium: 3.8 mmol/L (ref 3.5–5.1)
Sodium: 135 mmol/L (ref 135–145)
Total Bilirubin: 0.5 mg/dL (ref 0.3–1.2)
Total Protein: 6.4 g/dL — ABNORMAL LOW (ref 6.5–8.1)

## 2020-03-05 MED ORDER — POLYETHYLENE GLYCOL 3350 17 GM/SCOOP PO POWD: 17.0000 g | Freq: Every day | ORAL | 0 refills | Status: DC

## 2020-03-05 MED ORDER — FERROUS SULFATE 325 (65 FE) MG PO TABS: 325.0000 mg | ORAL_TABLET | ORAL | 0 refills | Status: DC

## 2020-03-05 MED ORDER — DOCUSATE SODIUM 100 MG PO CAPS: 100.0000 mg | ORAL_CAPSULE | Freq: Two times a day (BID) | ORAL | 2 refills | Status: DC | PRN

## 2020-03-05 MED ORDER — LACTATED RINGERS IV BOLUS
1000.0000 mL | Freq: Once | INTRAVENOUS | Status: AC
  Administered 2020-03-05: 1000 mL via INTRAVENOUS

## 2020-03-05 NOTE — Telephone Encounter (Signed)
Patient called stating she is at work and felt dizzy. The school nurse took blood pressure 76/36, heart rate 65 and blood sugar 95. Patient was currently laying on the floor at work. Consulted with Nolene Bernheim, NP patient should be seen at MAU. Patient advised to go to MAU for further evaluation.

## 2020-03-05 NOTE — MAU Provider Note (Signed)
Chief Complaint:  Hypotension   First Provider Initiated Contact with Patient 03/05/20 1042      HPI: [name] is a 29 y.o. G1P0 at [redacted]w[redacted]d by early ultrasound who presents to maternity admissions reporting an episode of dizziness this morning at school.  She felt sweaty and hot like she was going to pass out.  She laid down and the school nurse took a BP, which was 76/36.  She felt better in a few minutes and was brought to MAU via family car.  She reports she has eaten and been drinking water today.   She reports good fetal movement, denies LOF, vaginal bleeding, or abdominal pain.   Location: generalized Quality: dizziness, lightheadedness Severity: moderate to severe Duration: 30 minutes Timing: 2 episodes, once in school and once upon arrival in MAU Modifying factors: lying down improves symptoms Associated signs and symptoms: diaphoreses, feeling hot, near syncope  HPI  Past Medical History: Past Medical History:  Diagnosis Date  . Anxiety   . Costochondritis   . HSV-2 infection     Past obstetric history: OB History  Gravida Para Term Preterm AB Living  1            SAB TAB Ectopic Multiple Live Births               # Outcome Date GA Lbr Len/2nd Weight Sex Delivery Anes PTL Lv  1 Current             Past Surgical History: Past Surgical History:  Procedure Laterality Date  . COLPOSCOPY     Dr. Ouida Sills at [facility] 1.5-2 years ago    Family History: Family History  Problem Relation Age of Onset  . Ovarian cancer Paternal Aunt   . Diabetes Maternal Grandmother     Social History: Social History   Tobacco Use  . Smoking status: Never Smoker  . Smokeless tobacco: Never Used  Substance Use Topics  . Alcohol use: Not Currently  . Drug use: No    Allergies: No Known Allergies  Meds:  Medications Prior to Admission  Medication Sig Dispense Refill Last Dose  . Blood Pressure Monitoring (BLOOD PRESSURE MONITOR AUTOMAT) DEVI 1 Device by Does not  apply route daily. Automatic blood pressure cuff regular size. To monitor blood pressure regularly at home. ICD-10 code: O09.90 1 each 0   . Misc. Devices (GOJJI WEIGHT SCALE) MISC 1 Device by Does not apply route daily as needed. To weight self daily as needed at home. ICD-10 code: O09.90 1 each 0     ROS:  Review of Systems  Constitutional: Positive for diaphoresis. Negative for chills, fatigue and fever.  Eyes: Negative for visual disturbance.  Respiratory: Negative for shortness of breath.   Cardiovascular: Negative for chest pain.  Gastrointestinal: Negative for abdominal pain, nausea and vomiting.  Genitourinary: Negative for difficulty urinating, dysuria, flank pain, pelvic pain, vaginal bleeding, vaginal discharge and vaginal pain.  Neurological: Positive for dizziness and light-headedness. Negative for headaches.  Psychiatric/Behavioral: Negative.      I have reviewed patient's Past Medical Hx, Surgical Hx, Family Hx, Social Hx, medications and allergies.   Physical Exam   Patient Vitals for the past 24 hrs:  BP Temp Pulse Resp SpO2 Height Weight  03/05/20 1100 104/65 -- 76 -- -- -- --  03/05/20 1050 105/76 -- (!) 101 -- 100 % -- --  03/05/20 1047 116/79 -- 98 -- -- -- --  03/05/20 1038 (!) 91/57 -- 100 -- -- -- --  03/05/20 1037 (!) 80/51 -- 96 -- -- -- --  03/05/20 1035 (!) 82/40 -- 92 -- -- -- --  03/05/20 1011 101/64 98.6 F (37 C) 90 18 -- 5\' 1"  (1.549 m) 52.2 kg   Constitutional: Well-developed, well-nourished female in no acute distress.  HEART: normal rate, heart sounds, regular rhythm RESP: normal effort, lung sounds clear and equal bilaterally GI: Abd soft, non-tender, gravid appropriate for gestational age.  MS: Extremities nontender, no edema, normal ROM Neurologic: Alert and oriented x 4.  GU: Neg CVAT.  PELVIC EXAM: Deferred     FHT:  Baseline 145 , moderate variability, accelerations present, no decelerations Contractions: Irritability, mild to  palpation   Labs: Results for orders placed or performed during the hospital encounter of 03/05/20 (from the past 24 hour(s))  Urinalysis, Routine w reflex microscopic     Status: None   Collection Time: 03/05/20 10:39 AM  Result Value Ref Range   Color, Urine YELLOW YELLOW   APPearance CLEAR CLEAR   Specific Gravity, Urine 1.015 1.005 - 1.030   pH 6.0 5.0 - 8.0   Glucose, UA NEGATIVE NEGATIVE mg/dL   Hgb urine dipstick NEGATIVE NEGATIVE   Bilirubin Urine NEGATIVE NEGATIVE   Ketones, ur NEGATIVE NEGATIVE mg/dL   Protein, ur NEGATIVE NEGATIVE mg/dL   Nitrite NEGATIVE NEGATIVE   Leukocytes,Ua NEGATIVE NEGATIVE  CBC     Status: Abnormal   Collection Time: 03/05/20 10:52 AM  Result Value Ref Range   WBC 10.3 4.0 - 10.5 K/uL   RBC 3.22 (L) 3.87 - 5.11 MIL/uL   Hemoglobin 10.1 (L) 12.0 - 15.0 g/dL   HCT 05/05/20 (L) 09.3 - 23.5 %   MCV 93.8 80.0 - 100.0 fL   MCH 31.4 26.0 - 34.0 pg   MCHC 33.4 30.0 - 36.0 g/dL   RDW 57.3 22.0 - 25.4 %   Platelets 294 150 - 400 K/uL   nRBC 0.0 0.0 - 0.2 %  Comprehensive metabolic panel     Status: Abnormal   Collection Time: 03/05/20 10:52 AM  Result Value Ref Range   Sodium 135 135 - 145 mmol/L   Potassium 3.8 3.5 - 5.1 mmol/L   Chloride 103 98 - 111 mmol/L   CO2 22 22 - 32 mmol/L   Glucose, Bld 82 70 - 99 mg/dL   BUN 8 6 - 20 mg/dL   Creatinine, Ser 05/05/20 0.44 - 1.00 mg/dL   Calcium 8.5 (L) 8.9 - 10.3 mg/dL   Total Protein 6.4 (L) 6.5 - 8.1 g/dL   Albumin 2.9 (L) 3.5 - 5.0 g/dL   AST 26 15 - 41 U/L   ALT 25 0 - 44 U/L   Alkaline Phosphatase 58 38 - 126 U/L   Total Bilirubin 0.5 0.3 - 1.2 mg/dL   GFR calc non Af Amer >60 >60 mL/min   GFR calc Af Amer >60 >60 mL/min   Anion gap 10 5 - 15   B/Positive/-- (02/25 1003)  Imaging:  No results found.  MAU Course/MDM: Orders Placed This Encounter  Procedures  . Urinalysis, Routine w reflex microscopic  . CBC  . Comprehensive metabolic panel  . ED EKG  . Discharge patient    Meds ordered  this encounter  Medications  . lactated ringers bolus 1,000 mL  . ferrous sulfate 325 (65 FE) MG tablet    Sig: Take 1 tablet (325 mg total) by mouth every other day.    Dispense:  30 tablet    Refill:  0    Order Specific Question:   Supervising Provider    Answer:   Levie Heritage [[id number]]     NST reviewed and reactive or appropriate for gestational age   Pt with improved symptoms after IV fluids. CBC shows anemia, although mild it is a drop for the patient with Hgb down to 10.2 from 12.1.  Pt encouraged to drink plenty of water, eat regularly, avoid prolonged standing, and start iron supplement every other day for now.  F/U at [facility] as scheduled.  Note provided for pt to miss work/school the next 2 days.    Pt discharge with strict return precautions.    Assessment: 1. Postural dizziness with near syncope   2. Anemia affecting pregnancy in second trimester     Plan: Discharge home Labor precautions and fetal kick counts Follow-up Information    [facility] Follow up.   Specialty: Obstetrics and Gynecology Why: As scheduled, return to MAU as needed for emergencies. Contact information: [address] [place] [address] [phone]         Allergies as of 03/05/2020   No Known Allergies     Medication List    TAKE these medications   Blood Pressure Monitor Automat Devi 1 Device by Does not apply route daily. Automatic blood pressure cuff regular size. To monitor blood pressure regularly at home. ICD-10 code: O09.90   ferrous sulfate 325 (65 FE) MG tablet Take 1 tablet (325 mg total) by mouth every other day.   Gojji Weight Scale Misc 1 Device by Does not apply route daily as needed. To weight self daily as needed at home. ICD-10 code: O13.90       Sharen Counter Certified Nurse-Midwife 03/05/2020 1:32 PM

## 2020-03-05 NOTE — MAU Note (Signed)
Pt stated she was at school and felt lightheaded and dizzy. Felt sweaty and hot like she was going to pass out. She laied down and the school nurse took her b/p and it was 76/36.  After a few minutes she felt better and her mom brought her here. Stated she had eaten this morning and had been drinking some water as well  Denies any abd pain or cramping and good fetal movement reported

## 2020-03-21 ENCOUNTER — Ambulatory Visit (INDEPENDENT_AMBULATORY_CARE_PROVIDER_SITE_OTHER): Payer: BC Managed Care – PPO

## 2020-03-21 ENCOUNTER — Other Ambulatory Visit: Payer: Self-pay

## 2020-03-21 VITALS — BP 103/69 | HR 98 | Temp 97.9°F | Wt 117.4 lb

## 2020-03-21 DIAGNOSIS — Z3400 Encounter for supervision of normal first pregnancy, unspecified trimester: Secondary | ICD-10-CM

## 2020-03-21 DIAGNOSIS — O99012 Anemia complicating pregnancy, second trimester: Secondary | ICD-10-CM

## 2020-03-21 DIAGNOSIS — Z23 Encounter for immunization: Secondary | ICD-10-CM

## 2020-03-21 DIAGNOSIS — Z3A26 26 weeks gestation of pregnancy: Secondary | ICD-10-CM

## 2020-03-21 DIAGNOSIS — D649 Anemia, unspecified: Secondary | ICD-10-CM

## 2020-03-21 NOTE — Patient Instructions (Signed)

## 2020-03-21 NOTE — Progress Notes (Signed)
   LOW-RISK PREGNANCY OFFICE VISIT  Patient name: Atiya [name] MRN [id number]  Date of birth: [birth year] Chief Complaint:   Routine Prenatal Visit  Subjective:   [name] is a 29 y.o. G1P0 female at [redacted]w[redacted]d with an Estimated Date of Delivery: 06/22/20 being seen today for ongoing management of a low-risk pregnancy.  Today she reports no complaints. Patient reports that she has not had a syncope episode since her last incident. She expresses a concern of having a syncope episode during delivery.  She reports regular intake and snacks with proper hydration. She endorses fetal movement and denies vaginal concerns. She declines Tdap vaccination.  Contractions: Not present. Vag. Bleeding: None.  Movement: Present.   Reviewed past medical,surgical, social, obstetrical and family history as well as problem list, medications and allergies.  Objective   Vitals:   03/21/20 0814  BP: 103/69  Pulse: 98  Temp: 97.9 F (36.6 C)  Weight: 117 lb 6.4 oz (53.3 kg)  Body mass index is 22.18 kg/m.        Physical Examination:   General appearance: Well appearing, and in no distress  Mental status: Alert, oriented to person, place, and time  Skin: Warm & dry  Cardiovascular: Normal heart rate noted  Respiratory: Normal respiratory effort, no distress  Abdomen: Soft, gravid, nontender, AGA with Fundal height of Fundal Height: 27 cm  Pelvic: Cervical exam deferred           Extremities: Edema: None  Fetal Status: Fetal Heart Rate (bpm): 147  Movement: Present   No results found for this or any previous visit (from the past 24 hour(s)).  Assessment & Plan:  Low-risk pregnancy of a 29 y.o., G1P0 at [redacted]w[redacted]d with an Estimated Date of Delivery: 06/22/20   1. Supervision of normal first pregnancy, antepartum -Reports she has been researching pediatricians. -Provider gave verbal information regarding local pediatricians. -Discussed how results of GTT are handled including diabetic education and BS  testing for abnormal results and routine care for normal results.   2. Need for tetanus, diphtheria, and acellular pertussis (Tdap) vaccine in patient of adolescent age or older -Extensive discussion regarding benefits of Tdap in pregnancy. -Information given regarding Tdap. -After discussion, education, and recommendation patient agreeable.   3. Anemia affecting pregnancy in second trimester -Patient taking iron supplement every other day. -Reports some constipation that is relieved with Miralax as needed. -Informed that if results return lower will require further supplementation and iron infusion if necessary.     Meds: No orders of the defined types were placed in this encounter.  Labs/procedures today:  Lab Orders     Glucose Tolerance, 2 Hours w/1 Hour     HIV Antibody (routine testing w rflx)     RPR     CBC   Reviewed: Preterm labor symptoms and general obstetric precautions including but not limited to vaginal bleeding, contractions, leaking of fluid and fetal movement were reviewed in detail with the patient.  All questions were answered.  Follow-up: Return in about 4 weeks (around 04/18/2020) for Virtual LR-ROB.  Orders Placed This Encounter  Procedures  . Glucose Tolerance, 2 Hours w/1 Hour  . HIV Antibody (routine testing w rflx)  . RPR  . CBC   Cherre Robins MSN, CNM 03/21/2020

## 2020-03-22 LAB — CBC
Hematocrit: 30.3 % — ABNORMAL LOW (ref 34.0–46.6)
Hemoglobin: 9.9 g/dL — ABNORMAL LOW (ref 11.1–15.9)
MCH: 30.4 pg (ref 26.6–33.0)
MCHC: 32.7 g/dL (ref 31.5–35.7)
MCV: 93 fL (ref 79–97)
Platelets: 291 10*3/uL (ref 150–450)
RBC: 3.26 x10E6/uL — ABNORMAL LOW (ref 3.77–5.28)
RDW: 12.8 % (ref 11.7–15.4)
WBC: 8.4 10*3/uL (ref 3.4–10.8)

## 2020-03-22 LAB — HIV ANTIBODY (ROUTINE TESTING W REFLEX): HIV Screen 4th Generation wRfx: NONREACTIVE

## 2020-03-22 LAB — GLUCOSE TOLERANCE, 2 HOURS W/ 1HR
Glucose, 1 hour: 112 mg/dL (ref 65–179)
Glucose, 2 hour: 107 mg/dL (ref 65–152)
Glucose, Fasting: 72 mg/dL (ref 65–91)

## 2020-03-22 LAB — RPR: RPR Ser Ql: NONREACTIVE

## 2020-03-25 ENCOUNTER — Other Ambulatory Visit: Payer: Self-pay | Admitting: *Deleted

## 2020-03-25 MED ORDER — FERROUS SULFATE 325 (65 FE) MG PO TABS: 325.0000 mg | ORAL_TABLET | ORAL | 0 refills | Status: DC

## 2020-03-25 NOTE — Progress Notes (Signed)
Received fax from [facility] requesting to change quantity to 90 day supply. Clovis Pu, RN

## 2020-04-09 ENCOUNTER — Other Ambulatory Visit: Payer: Self-pay

## 2020-04-09 ENCOUNTER — Inpatient Hospital Stay (HOSPITAL_COMMUNITY)
Admission: AD | Admit: 2020-04-09 | Discharge: 2020-04-09 | Disposition: A | Discharge status: Home / Self Care | Payer: BC Managed Care – PPO | Attending: Obstetrics and Gynecology | Admitting: Obstetrics and Gynecology

## 2020-04-09 ENCOUNTER — Encounter (HOSPITAL_COMMUNITY): Payer: Self-pay | Admitting: Obstetrics and Gynecology

## 2020-04-09 DIAGNOSIS — R42 Dizziness and giddiness: Secondary | ICD-10-CM | POA: Diagnosis not present

## 2020-04-09 DIAGNOSIS — O26893 Other specified pregnancy related conditions, third trimester: Secondary | ICD-10-CM | POA: Diagnosis not present

## 2020-04-09 DIAGNOSIS — O99343 Other mental disorders complicating pregnancy, third trimester: Secondary | ICD-10-CM | POA: Diagnosis not present

## 2020-04-09 DIAGNOSIS — Z72820 Sleep deprivation: Secondary | ICD-10-CM

## 2020-04-09 DIAGNOSIS — R109 Unspecified abdominal pain: Secondary | ICD-10-CM

## 2020-04-09 DIAGNOSIS — Z3A29 29 weeks gestation of pregnancy: Secondary | ICD-10-CM | POA: Insufficient documentation

## 2020-04-09 DIAGNOSIS — R768 Other specified abnormal immunological findings in serum: Secondary | ICD-10-CM

## 2020-04-09 DIAGNOSIS — Z3400 Encounter for supervision of normal first pregnancy, unspecified trimester: Secondary | ICD-10-CM

## 2020-04-09 DIAGNOSIS — F419 Anxiety disorder, unspecified: Secondary | ICD-10-CM | POA: Insufficient documentation

## 2020-04-09 DIAGNOSIS — I959 Hypotension, unspecified: Secondary | ICD-10-CM | POA: Insufficient documentation

## 2020-04-09 LAB — URINALYSIS, ROUTINE W REFLEX MICROSCOPIC
Bilirubin Urine: NEGATIVE
Glucose, UA: NEGATIVE mg/dL
Hgb urine dipstick: NEGATIVE
Ketones, ur: NEGATIVE mg/dL
Leukocytes,Ua: NEGATIVE
Nitrite: NEGATIVE
Protein, ur: NEGATIVE mg/dL
Specific Gravity, Urine: 1.008 (ref 1.005–1.030)
pH: 5 (ref 5.0–8.0)

## 2020-04-09 NOTE — MAU Note (Signed)
[name] is a 29 y.o. at [redacted]w[redacted]d here in MAU reporting: woke up about an hour ago and felt lightheaded and dizzy. States BP dropped low, it was 90/57. Also having slight cramping. No bleeding, LOF, or discharge. +FM  Onset of complaint: today  Pain score: 3/10  Vitals:   04/09/20 0716  BP: 117/69  Pulse: (!) 106  Resp: 16  Temp: 98.5 F (36.9 C)  SpO2: 98%     FHT: +FM  Lab orders placed from triage: UA

## 2020-04-09 NOTE — MAU Provider Note (Addendum)
Chief Complaint:  Hypotension   First Provider Initiated Contact with Patient 04/09/20 0758     HPI  HPI: [name] is a 29 y.o. G1P0 at 39w3dwho presents to maternity admissions reporting dizziness/lightheadedness.  States her BP was low.  Also c/o mild cramping.  . She reports good fetal movement, denies LOF, vaginal bleeding, vaginal itching/burning, urinary symptoms, h/a, dizziness, n/v, diarrhea, constipation or fever/chills.  She denies headache, visual changes or RUQ abdominal pain.  RN Note: Akylah Hascall is a 29 y.o. at [redacted]w[redacted]d here in MAU reporting: woke up about an hour ago and felt lightheaded and dizzy. States BP dropped low, it was 90/57. Also having slight cramping. No bleeding, LOF, or discharge. +FM  Past Medical History: Past Medical History:  Diagnosis Date  . Anxiety   . Costochondritis   . HSV-2 infection     Past obstetric history: OB History  Gravida Para Term Preterm AB Living  1            SAB TAB Ectopic Multiple Live Births               # Outcome Date GA Lbr Len/2nd Weight Sex Delivery Anes PTL Lv  1 Current             Past Surgical History: Past Surgical History:  Procedure Laterality Date  . COLPOSCOPY     Dr. Dareen Piano at [facility] 1.5-2 years ago    Family History: Family History  Problem Relation Age of Onset  . Ovarian cancer Paternal Aunt   . Diabetes Maternal Grandmother     Social History: Social History   Tobacco Use  . Smoking status: Never Smoker  . Smokeless tobacco: Never Used  Vaping Use  . Vaping Use: Never used  Substance Use Topics  . Alcohol use: Not Currently  . Drug use: No    Allergies: No Known Allergies  Meds:  Medications Prior to Admission  Medication Sig Dispense Refill Last Dose  . Blood Pressure Monitoring (BLOOD PRESSURE MONITOR AUTOMAT) DEVI 1 Device by Does not apply route daily. Automatic blood pressure cuff regular size. To monitor blood pressure regularly at home. ICD-10 code: O09.90  1 each 0   . docusate sodium (COLACE) 100 MG capsule Take 1 capsule (100 mg total) by mouth 2 (two) times daily as needed. 30 capsule 2   . ferrous sulfate 325 (65 FE) MG tablet Take 1 tablet (325 mg total) by mouth every other day. 45 tablet 0   . Misc. Devices (GOJJI WEIGHT SCALE) MISC 1 Device by Does not apply route daily as needed. To weight self daily as needed at home. ICD-10 code: O09.90 1 each 0   . polyethylene glycol powder (GLYCOLAX/MIRALAX) 17 GM/SCOOP powder Take 17 g by mouth daily. 255 g 0     I have reviewed patient's Past Medical Hx, Surgical Hx, Family Hx, Social Hx, medications and allergies.   ROS:  Review of Systems  Constitutional: Negative for chills, diaphoresis, fatigue and fever.  Respiratory: Negative for shortness of breath.   Gastrointestinal: Positive for abdominal pain. Negative for constipation, diarrhea and nausea.  Genitourinary: Positive for pelvic pain (mild cramping). Negative for dysuria and vaginal bleeding.  Musculoskeletal: Negative for back pain.  Neurological: Positive for dizziness and light-headedness. Negative for headaches.   Other systems negative  Physical Exam   Patient Vitals for the past 24 hrs:  BP Temp Temp src Pulse Resp SpO2 Height Weight  04/09/20 0716 117/69 98.5 F (36.9 C)  Oral (!) 106 16 98 % -- --  04/09/20 0649 -- -- -- -- -- -- 5\' 1"  (1.549 m) 54.3 kg   Constitutional: Well-developed, well-nourished female in no acute distress.  Cardiovascular: normal rate and rhythm Respiratory: normal effort GI: Abd soft, non-tender, gravid appropriate for gestational age.   No rebound or guarding. MS: Extremities nontender, no edema, normal ROM Neurologic: Alert and oriented x 4.  GU: Neg CVAT.  PELVIC EXAM: deferred for MSE   FHT:  Baseline 140 , moderate variability, accelerations present, no decelerations Contractions: uterine irritability   Labs: Results for orders placed or performed during the hospital encounter of  04/09/20 (from the past 24 hour(s))  Urinalysis, Routine w reflex microscopic     Status: None   Collection Time: 04/09/20  7:23 AM  Result Value Ref Range   Color, Urine YELLOW YELLOW   APPearance CLEAR CLEAR   Specific Gravity, Urine 1.008 1.005 - 1.030   pH 5.0 5.0 - 8.0   Glucose, UA NEGATIVE NEGATIVE mg/dL   Hgb urine dipstick NEGATIVE NEGATIVE   Bilirubin Urine NEGATIVE NEGATIVE   Ketones, ur NEGATIVE NEGATIVE mg/dL   Protein, ur NEGATIVE NEGATIVE mg/dL   Nitrite NEGATIVE NEGATIVE   Leukocytes,Ua NEGATIVE NEGATIVE   B/Positive/-- (02/25 1003)  Imaging:  No results found.  MAU Course/MDM: I have ordered Orthostatic VS NST reviewed  Assessment:  Plan: Care turned over to Dr 03-17-1995  Salomon Mast CNM, MSN Certified Nurse-Midwife 04/09/2020 7:58 AM   Care assumed from 06/10/2020 CNM, MSN at [phone]  Orthostatic vital signs normal. BP in MAU normal.   Discussed with patient, she does not sleep well, tends to have racing thoughts and her hips have started hurting. She does walk daily, but does not do any prenatal yoga. She tries to listen to calming sounds at night. Does not take melatonin.  We reviewed medications that are safe in pregnancy to take. I recommended prenatal yoga and bedtime stretching.  NST -baseline: 140 -variability: moderate -accels: 10x10 -decels: none -interpretation: reactive -TOCO: no CTX  PLAN: -DC to home in stable condition -Recommend 2 L water daily -Recommend melatonin/Benadryl for sleep, did educate that this should not be an every day thing -Recommend tylenol for pain, as well as heating pads  Myra Weng, [id number], DO OB Fellow, [facility] 04/09/2020 9:25 AM

## 2020-04-09 NOTE — Discharge Instructions (Signed)

## 2020-04-11 ENCOUNTER — Other Ambulatory Visit: Payer: Self-pay

## 2020-04-11 ENCOUNTER — Encounter (HOSPITAL_COMMUNITY): Payer: Self-pay | Admitting: Obstetrics and Gynecology

## 2020-04-11 ENCOUNTER — Inpatient Hospital Stay (HOSPITAL_COMMUNITY)
Admission: AD | Admit: 2020-04-11 | Discharge: 2020-04-11 | Disposition: A | Discharge status: Home / Self Care | Payer: BC Managed Care – PPO | Attending: Obstetrics and Gynecology | Admitting: Obstetrics and Gynecology

## 2020-04-11 DIAGNOSIS — Z79899 Other long term (current) drug therapy: Secondary | ICD-10-CM | POA: Insufficient documentation

## 2020-04-11 DIAGNOSIS — R55 Syncope and collapse: Secondary | ICD-10-CM

## 2020-04-11 DIAGNOSIS — O26893 Other specified pregnancy related conditions, third trimester: Secondary | ICD-10-CM | POA: Diagnosis not present

## 2020-04-11 DIAGNOSIS — R42 Dizziness and giddiness: Secondary | ICD-10-CM | POA: Diagnosis not present

## 2020-04-11 DIAGNOSIS — R110 Nausea: Secondary | ICD-10-CM | POA: Diagnosis not present

## 2020-04-11 DIAGNOSIS — Z3A29 29 weeks gestation of pregnancy: Secondary | ICD-10-CM

## 2020-04-11 DIAGNOSIS — R0602 Shortness of breath: Secondary | ICD-10-CM | POA: Diagnosis not present

## 2020-04-11 DIAGNOSIS — D509 Iron deficiency anemia, unspecified: Secondary | ICD-10-CM | POA: Diagnosis not present

## 2020-04-11 DIAGNOSIS — D649 Anemia, unspecified: Secondary | ICD-10-CM | POA: Insufficient documentation

## 2020-04-11 DIAGNOSIS — O99013 Anemia complicating pregnancy, third trimester: Secondary | ICD-10-CM | POA: Insufficient documentation

## 2020-04-11 DIAGNOSIS — R531 Weakness: Secondary | ICD-10-CM | POA: Insufficient documentation

## 2020-04-11 LAB — COMPREHENSIVE METABOLIC PANEL
ALT: 16 U/L (ref 0–44)
AST: 21 U/L (ref 15–41)
Albumin: 2.9 g/dL — ABNORMAL LOW (ref 3.5–5.0)
Alkaline Phosphatase: 90 U/L (ref 38–126)
Anion gap: 9 (ref 5–15)
BUN: 7 mg/dL (ref 6–20)
CO2: 21 mmol/L — ABNORMAL LOW (ref 22–32)
Calcium: 8.7 mg/dL — ABNORMAL LOW (ref 8.9–10.3)
Chloride: 103 mmol/L (ref 98–111)
Creatinine, Ser: 0.63 mg/dL (ref 0.44–1.00)
GFR calc Af Amer: >60 mL/min (ref >60)
GFR calc non Af Amer: >60 mL/min (ref >60)
Glucose, Bld: 88 mg/dL (ref 70–99)
Potassium: 3.8 mmol/L (ref 3.5–5.1)
Sodium: 133 mmol/L — ABNORMAL LOW (ref 135–145)
Total Bilirubin: 0.4 mg/dL (ref 0.3–1.2)
Total Protein: 6.5 g/dL (ref 6.5–8.1)

## 2020-04-11 LAB — URINALYSIS, ROUTINE W REFLEX MICROSCOPIC
Bilirubin Urine: NEGATIVE
Glucose, UA: >=500 mg/dL — AB
Hgb urine dipstick: NEGATIVE
Ketones, ur: NEGATIVE mg/dL
Leukocytes,Ua: NEGATIVE
Nitrite: NEGATIVE
Protein, ur: NEGATIVE mg/dL
Specific Gravity, Urine: 1.02 (ref 1.005–1.030)
pH: 5 (ref 5.0–8.0)

## 2020-04-11 LAB — CBC
HCT: 29.4 % — ABNORMAL LOW (ref 36.0–46.0)
Hemoglobin: 9.8 g/dL — ABNORMAL LOW (ref 12.0–15.0)
MCH: 30.1 pg (ref 26.0–34.0)
MCHC: 33.3 g/dL (ref 30.0–36.0)
MCV: 90.2 fL (ref 80.0–100.0)
Platelets: 279 10*3/uL (ref 150–400)
RBC: 3.26 MIL/uL — ABNORMAL LOW (ref 3.87–5.11)
RDW: 12.5 % (ref 11.5–15.5)
WBC: 10.3 10*3/uL (ref 4.0–10.5)
nRBC: 0 % (ref 0.0–0.2)

## 2020-04-11 MED ORDER — SODIUM CHLORIDE 0.9 % IV SOLN
510.0000 mg | Freq: Once | INTRAVENOUS | Status: AC
  Administered 2020-04-11: 510 mg via INTRAVENOUS
  Filled 2020-04-11: qty 17

## 2020-04-11 MED ORDER — SODIUM CHLORIDE 0.9 % IV SOLN: Freq: Once | INTRAVENOUS | Status: AC

## 2020-04-11 NOTE — Discharge Instructions (Signed)
Pregnancy and Anemia ° °Anemia is a condition in which the concentration of red blood cells, or hemoglobin, in the blood is below normal. Hemoglobin is a substance in red blood cells that carries oxygen to the tissues of the body. Anemia results when enough oxygen does not reach these tissues. °Anemia is common during pregnancy because the woman's body needs more blood volume and blood cells to provide nutrition to the fetus. The fetus needs iron and folic acid as it is developing. Your body may not produce enough red blood cells because of this. Also, during pregnancy, the liquid part of the blood (plasma) increases by about 30-50%, and the red blood cells increase by only 20%. This lowers the concentration of the red blood cells and creates a natural anemia-like situation. °What are the causes? °The most common cause of anemia during pregnancy is not having enough iron in the body to make red blood cells (iron deficiency anemia). Other causes may include: °· Folic acid deficiency. °· Vitamin B12 deficiency. °· Certain prescription or over-the-counter medicines. °· Certain medical conditions or infections that destroy red blood cells. °· A low platelet count and bleeding caused by antibodies that go through the placenta to the fetus from the mother’s blood. °What are the signs or symptoms? °Mild anemia may not be noticeable. If it becomes severe, symptoms may include: °· Feeling tired (fatigue). °· Shortness of breath, especially during activity. °· Weakness. °· Fainting. °· Pale looking skin. °· Headaches. °· A fast or irregular heartbeat (palpitations). °· Dizziness. °How is this diagnosed? °This condition may be diagnosed based on: °· Your medical history and a physical exam. °· Blood tests. °How is this treated? °Treatment for anemia during pregnancy depends on the cause of the anemia. Treatment can include: °· Dietary changes. °· Supplements of iron, vitamin B12, or folic acid. °· A blood transfusion. This may  be needed if anemia is severe. °· Hospitalization. This may be needed if there is a lot of blood loss or severe anemia. °Follow these instructions at home: °· Follow recommendations from your dietitian or health care provider about changing your diet. °· Increase your vitamin C intake. This will help the stomach absorb more iron. Some foods that are high in vitamin C include: °? Oranges. °? Peppers. °? Tomatoes. °? Mangoes. °· Eat a diet rich in iron. This would include foods such as: °? Liver. °? Beef. °? Eggs. °? Whole grains. °? Spinach. °? Dried fruit. °· Take iron and vitamins as told by your health care provider. °· Eat green leafy vegetables. These are a good source of folic acid. °· Keep all follow-up visits as told by your health care provider. This is important. °Contact a health care provider if: °· You have frequent or lasting headaches. °· You look pale. °· You bruise easily. °Get help right away if: °· You have extreme weakness, shortness of breath, or chest pain. °· You become dizzy or have trouble concentrating. °· You have heavy vaginal bleeding. °· You develop a rash. °· You have bloody or black, tarry stools. °· You faint. °· You vomit up blood. °· You vomit repeatedly. °· You have abdominal pain. °· You have a fever. °· You are dehydrated. °Summary °· Anemia is a condition in which the concentration of red blood cells or hemoglobin in the blood is below normal. °· Anemia is common during pregnancy because the woman's body needs more blood volume and blood cells to provide nutrition to the fetus. °· The most   common cause of anemia during pregnancy is not having enough iron in the body to make red blood cells (iron deficiency anemia). °· Mild anemia may not be noticeable. If it becomes severe, symptoms may include feeling tired and weak. °This information is not intended to replace advice given to you by your health care provider. Make sure you discuss any questions you have with your health care  provider. °Document Revised: 03/06/2019 Document Reviewed: 10/26/2016 °Elsevier Patient Education © 2020 Elsevier Inc[employer] ° °

## 2020-04-11 NOTE — MAU Note (Signed)
Pt presents to MAU for weakness that she was experiencing earlier in the day, she states she was feeling sweaty and feeling faint/weak. She has had abdominal cramping off and on for the last few weeks. Pt denies VB and LOF. +FM

## 2020-04-11 NOTE — MAU Provider Note (Signed)
History     CSN: [id number]  Arrival date and time: 04/11/20 1332   First Provider Initiated Contact with Patient 04/11/20 1413      Chief Complaint  Patient presents with  . Fatigue  . Nausea   HPI [name] is a 29 y.o. G1P0 at [redacted]w[redacted]d who presents to MAU for evaluation of recurrent near-syncopal episodes. Patient was reclining flat on her back during her hair appointment, began to feel short of breath and "about to faint". She discontinued the shampoo, repositioned to [employer] and her symptoms improved but did not resolve. She asked her stylist to dry her hair and she gradually felt better during that time. She reports that she feels "like normal" on arrival to MAU. She denies syncope, weakness, chest pain, palpitations. She denies vaginal bleeding, leaking of fluid, decreased fetal movement, fever, falls, or recent illness.   She receives care [facility] and her next appt is 07/30.  OB History    Gravida  1   Para      Term      Preterm      AB      Living        SAB      TAB      Ectopic      Multiple      Live Births              Past Medical History:  Diagnosis Date  . Anxiety   . Costochondritis   . HSV-2 infection     Past Surgical History:  Procedure Laterality Date  . COLPOSCOPY     Dr. Dareen Piano at [facility] 1.5-2 years ago    Family History  Problem Relation Age of Onset  . Ovarian cancer Paternal Aunt   . Diabetes Maternal Grandmother     Social History   Tobacco Use  . Smoking status: Never Smoker  . Smokeless tobacco: Never Used  Vaping Use  . Vaping Use: Never used  Substance Use Topics  . Alcohol use: Not Currently  . Drug use: No    Allergies: No Known Allergies  Medications Prior to Admission  Medication Sig Dispense Refill Last Dose  . Blood Pressure Monitoring (BLOOD PRESSURE MONITOR AUTOMAT) DEVI 1 Device by Does not apply route daily. Automatic blood pressure cuff regular size. To monitor  blood pressure regularly at home. ICD-10 code: O09.90 1 each 0 Past Week at Unknown time  . docusate sodium (COLACE) 100 MG capsule Take 1 capsule (100 mg total) by mouth 2 (two) times daily as needed. 30 capsule 2 04/10/2020 at Unknown time  . ferrous sulfate 325 (65 FE) MG tablet Take 1 tablet (325 mg total) by mouth every other day. 45 tablet 0 04/11/2020 at Unknown time  . Misc. Devices (GOJJI WEIGHT SCALE) MISC 1 Device by Does not apply route daily as needed. To weight self daily as needed at home. ICD-10 code: O09.90 1 each 0   . polyethylene glycol powder (GLYCOLAX/MIRALAX) 17 GM/SCOOP powder Take 17 g by mouth daily. 255 g 0 More than a month at Unknown time    Review of Systems  Constitutional: Positive for fatigue.  Cardiovascular: Negative for chest pain and palpitations.  Gastrointestinal: Negative for abdominal pain, nausea and vomiting.  Genitourinary: Negative for vaginal bleeding.  Musculoskeletal: Negative for back pain.  Neurological: Positive for dizziness. Negative for syncope and weakness.  All other systems reviewed and are negative.  Physical Exam   Blood pressure 109/74,  pulse 94, temperature 97.9 F (36.6 C), temperature source Oral, resp. rate 18, last menstrual period 08/13/2019, SpO2 100 %.  Physical Exam Vitals and nursing note reviewed. Exam conducted with a chaperone present.  Constitutional:      General: She is not in acute distress.    Appearance: She is not ill-appearing or toxic-appearing.  Cardiovascular:     Rate and Rhythm: Normal rate.     Pulses: Normal pulses.     Heart sounds: Normal heart sounds.  Pulmonary:     Effort: Pulmonary effort is normal. No respiratory distress.     Breath sounds: Normal breath sounds.  Abdominal:     Comments: Gravid  Skin:    Capillary Refill: Capillary refill takes less than 2 seconds.  Neurological:     General: No focal deficit present.     Mental Status: She is alert.  Psychiatric:        Mood and  Affect: Mood normal.     MAU Course  Procedures  --Abnormal EKG x 3. Patient verbalizes she is willing to have EKG administered but also states she is unable to tolerate position required for accurate reading. Held position for about ten seconds per MAU NT then actively repositioned throughout attempts. ROS, patient position tolerance and EKG output reviewed with Dr. Crissie Reese, who advised outpatient Cards followup --Reactive tracing: baseline 140, moderate variability, positive accels, no decels --Toco: occasional UI  Orders Placed This Encounter  Procedures  . Urinalysis, Routine w reflex microscopic  . CBC  . Comprehensive metabolic panel  . Ambulatory referral to Cardiology  . ED EKG  . EKG 12-Lead  . EKG 12-Lead  . Insert peripheral IV   Meds ordered this encounter  Medications  . ferumoxytol (FERAHEME) 510 mg in sodium chloride 0.9 % 100 mL IVPB  . 0.9 %  sodium chloride infusion   Results for orders placed or performed during the hospital encounter of 04/11/20 (from the past 24 hour(s))  Urinalysis, Routine w reflex microscopic     Status: Abnormal   Collection Time: 04/11/20  2:18 PM  Result Value Ref Range   Color, Urine YELLOW YELLOW   APPearance CLEAR CLEAR   Specific Gravity, Urine 1.020 1.005 - 1.030   pH 5.0 5.0 - 8.0   Glucose, UA >=500 (A) NEGATIVE mg/dL   Hgb urine dipstick NEGATIVE NEGATIVE   Bilirubin Urine NEGATIVE NEGATIVE   Ketones, ur NEGATIVE NEGATIVE mg/dL   Protein, ur NEGATIVE NEGATIVE mg/dL   Nitrite NEGATIVE NEGATIVE   Leukocytes,Ua NEGATIVE NEGATIVE   RBC / HPF 0-5 0 - 5 RBC/hpf   WBC, UA 0-5 0 - 5 WBC/hpf   Bacteria, UA RARE (A) NONE SEEN   Squamous Epithelial / LPF 0-5 0 - 5   Mucus PRESENT   CBC     Status: Abnormal   Collection Time: 04/11/20  3:21 PM  Result Value Ref Range   WBC 10.3 4.0 - 10.5 K/uL   RBC 3.26 (L) 3.87 - 5.11 MIL/uL   Hemoglobin 9.8 (L) 12.0 - 15.0 g/dL   HCT 47.4 (L) 36 - 46 %   MCV 90.2 80.0 - 100.0 fL   MCH  30.1 26.0 - 34.0 pg   MCHC 33.3 30.0 - 36.0 g/dL   RDW 25.9 56.3 - 87.5 %   Platelets 279 150 - 400 K/uL   nRBC 0.0 0.0 - 0.2 %  Comprehensive metabolic panel     Status: Abnormal   Collection Time: 04/11/20  3:21 PM  Result Value Ref Range   Sodium 133 (L) 135 - 145 mmol/L   Potassium 3.8 3.5 - 5.1 mmol/L   Chloride 103 98 - 111 mmol/L   CO2 21 (L) 22 - 32 mmol/L   Glucose, Bld 88 70 - 99 mg/dL   BUN 7 6 - 20 mg/dL   Creatinine, Ser 1.50 0.44 - 1.00 mg/dL   Calcium 8.7 (L) 8.9 - 10.3 mg/dL   Total Protein 6.5 6.5 - 8.1 g/dL   Albumin 2.9 (L) 3.5 - 5.0 g/dL   AST 21 15 - 41 U/L   ALT 16 0 - 44 U/L   Alkaline Phosphatase 90 38 - 126 U/L   Total Bilirubin 0.4 0.3 - 1.2 mg/dL   GFR calc non Af Amer >60 >60 mL/min   GFR calc Af Amer >60 >60 mL/min   Anion gap 9 5 - 15   --Feraheme 1 of 2 administered in MAU. Tolerated well by patient --Discussed previous normal orthostatic blood pressures, normal labs today --Advised ongoing interventions: protein-based meals and snacks, slow position changes, PO hydration with water   Assessment and Plan  --29 y.o. G1P0 at [redacted]w[redacted]d  --Reactive tracing --Recurrent symptomatic anemia --Hemodilution without activity intolerance during MAU evaluation --S/p Ferahame 1 of 2 --Patient ambulating independently, performing ADLs throughout evaluation period --Discharge home in stable condition  F/U; --[facility] messaged to coordinate Feraheme 2 of 2 and outpatient Cardiology consult --Next OB appt 07/30  Calvert Cantor, CNM 04/11/2020, 9:23 PM

## 2020-04-14 ENCOUNTER — Other Ambulatory Visit: Payer: Self-pay | Admitting: *Deleted

## 2020-04-14 DIAGNOSIS — O99013 Anemia complicating pregnancy, third trimester: Secondary | ICD-10-CM

## 2020-04-14 NOTE — Progress Notes (Signed)
Second dosage of Feraheme scheduled for 04/24/20 at 10 AM.  Clovis Pu, RN

## 2020-04-16 ENCOUNTER — Encounter: Payer: Self-pay | Admitting: General Practice

## 2020-04-24 ENCOUNTER — Encounter (HOSPITAL_COMMUNITY): Payer: BC Managed Care – PPO

## 2020-04-25 ENCOUNTER — Encounter (HOSPITAL_COMMUNITY)
Admission: RE | Admit: 2020-04-25 | Discharge: 2020-04-25 | Disposition: A | Discharge status: Home / Self Care | Payer: BC Managed Care – PPO | Source: Ambulatory Visit | Attending: Advanced Practice Midwife | Admitting: Advanced Practice Midwife

## 2020-04-25 ENCOUNTER — Other Ambulatory Visit: Payer: Self-pay

## 2020-04-25 DIAGNOSIS — O99013 Anemia complicating pregnancy, third trimester: Secondary | ICD-10-CM | POA: Insufficient documentation

## 2020-04-25 MED ORDER — SODIUM CHLORIDE 0.9 % IV SOLN
510.0000 mg | Freq: Once | INTRAVENOUS | Status: AC
  Administered 2020-04-25: 510 mg via INTRAVENOUS
  Filled 2020-04-25: qty 17

## 2020-05-02 ENCOUNTER — Telehealth (INDEPENDENT_AMBULATORY_CARE_PROVIDER_SITE_OTHER): Payer: BC Managed Care – PPO

## 2020-05-02 ENCOUNTER — Other Ambulatory Visit: Payer: Self-pay

## 2020-05-02 VITALS — BP 127/91 | HR 111 | Wt 123.2 lb

## 2020-05-02 DIAGNOSIS — R42 Dizziness and giddiness: Secondary | ICD-10-CM

## 2020-05-02 DIAGNOSIS — O26893 Other specified pregnancy related conditions, third trimester: Secondary | ICD-10-CM

## 2020-05-02 DIAGNOSIS — Z3A32 32 weeks gestation of pregnancy: Secondary | ICD-10-CM

## 2020-05-02 DIAGNOSIS — O99013 Anemia complicating pregnancy, third trimester: Secondary | ICD-10-CM

## 2020-05-02 DIAGNOSIS — Z3400 Encounter for supervision of normal first pregnancy, unspecified trimester: Secondary | ICD-10-CM

## 2020-05-02 DIAGNOSIS — O288 Other abnormal findings on antenatal screening of mother: Secondary | ICD-10-CM

## 2020-05-02 DIAGNOSIS — R768 Other specified abnormal immunological findings in serum: Secondary | ICD-10-CM

## 2020-05-02 NOTE — Patient Instructions (Signed)
Dizziness Dizziness is a common problem. It is a feeling of unsteadiness or light-headedness. You may feel like you are about to faint. Dizziness can lead to injury if you stumble or fall. Anyone can become dizzy, but dizziness is more common in older adults. This condition can be caused by a number of things, including medicines, dehydration, or illness. Follow these instructions at home: Eating and drinking  Drink enough fluid to keep your urine clear or pale yellow. This helps to keep you from becoming dehydrated. Try to drink more clear fluids, such as water.  Do not drink alcohol.  Limit your caffeine intake if told to do so by your health care provider. Check ingredients and nutrition facts to see if a food or beverage contains caffeine.  Limit your salt (sodium) intake if told to do so by your health care provider. Check ingredients and nutrition facts to see if a food or beverage contains sodium. Activity  Avoid making quick movements. ? Rise slowly from chairs and steady yourself until you feel okay. ? In the morning, first sit up on the side of the bed. When you feel okay, stand slowly while you hold onto something until you know that your balance is fine.  If you need to stand in one place for a long time, move your legs often. Tighten and relax the muscles in your legs while you are standing.  Do not drive or use heavy machinery if you feel dizzy.  Avoid bending down if you feel dizzy. Place items in your home so that they are easy for you to reach without leaning over. Lifestyle  Do not use any products that contain nicotine or tobacco, such as cigarettes and e-cigarettes. If you need help quitting, ask your health care provider.  Try to reduce your stress level by using methods such as yoga or meditation. Talk with your health care provider if you need help to manage your stress. General instructions  Watch your dizziness for any changes.  Take over-the-counter and  prescription medicines only as told by your health care provider. Talk with your health care provider if you think that your dizziness is caused by a medicine that you are taking.  Tell a friend or a family member that you are feeling dizzy. If he or she notices any changes in your behavior, have this person call your health care provider.  Keep all follow-up visits as told by your health care provider. This is important. Contact a health care provider if:  Your dizziness does not go away.  Your dizziness or light-headedness gets worse.  You feel nauseous.  You have reduced hearing.  You have new symptoms.  You are unsteady on your feet or you feel like the room is spinning. Get help right away if:  You vomit or have diarrhea and are unable to eat or drink anything.  You have problems talking, walking, swallowing, or using your arms, hands, or legs.  You feel generally weak.  You are not thinking clearly or you have trouble forming sentences. It may take a friend or family member to notice this.  You have chest pain, abdominal pain, shortness of breath, or sweating.  Your vision changes.  You have any bleeding.  You have a severe headache.  You have neck pain or a stiff neck.  You have a fever. These symptoms may represent a serious problem that is an emergency. Do not wait to see if the symptoms will go away. Get medical help   right away. Call your local emergency services (911 in the [place] Do not drive yourself to the hospital. Summary  Dizziness is a feeling of unsteadiness or light-headedness. This condition can be caused by a number of things, including medicines, dehydration, or illness.  Anyone can become dizzy, but dizziness is more common in older adults.  Drink enough fluid to keep your urine clear or pale yellow. Do not drink alcohol.  Avoid making quick movements if you feel dizzy. Monitor your dizziness for any changes. This information is not intended to  replace advice given to you by your health care provider. Make sure you discuss any questions you have with your health care provider. Document Revised: 09/23/2017 Document Reviewed: 10/23/2016 Elsevier Patient Education  2020 [employer]  

## 2020-05-02 NOTE — Progress Notes (Signed)
OBSTETRICS PRENATAL VIRTUAL VISIT ENCOUNTER NOTE  Provider location: Center for [facility]   I connected with [name] on 05/02/20 at 11:10 AM EDT by MyChart Video Encounter at home and verified that I am speaking with the correct person using two identifiers.   I discussed the limitations, risks, security and privacy concerns of performing an evaluation and management service virtually and the availability of in person appointments. I also discussed with the patient that there may be a patient responsible charge related to this service. The patient expressed understanding and agreed to proceed. Subjective:  [name] is a 29 y.o. G1P0 at [redacted]w[redacted]d being seen today for ongoing prenatal care.  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  Patient reports dizziness. She states it occurs about 3x/week and causes lightheadedness, sweating, and feelings of syncope.  She states it occurs randomly and continues despite iron infusions.  She reports taking her iron supplement every other day.  She endorses fetal movement and occasional abdominal cramping.  She also states that she is taking a glucose pill for when she has the episodes and it feels like her blood sugar is low.  Contractions: Not present. Vag. Bleeding: None.  Movement: Present. Denies any leaking of fluid.   The following portions of the patient's history were reviewed and updated as appropriate: allergies, current medications, past family history, past medical history, past social history, past surgical history and problem list.   Objective:   Vitals:   05/02/20 1104  BP: (!) 127/91  Pulse: (!) 111  Weight: 123 lb 3.2 oz (55.9 kg)    Fetal Status:     Movement: Present     General:  Alert, oriented and cooperative. Patient is in no acute distress.  Respiratory:  Normal respiratory effort, no problems with respiration noted  Mental Status: Normal mood and affect. Normal behavior. Normal judgment and thought content.  Rest of physical exam deferred due to type of encounter  Imaging: No results found.  Assessment and Plan:  Pregnancy: G1P0 at [redacted]w[redacted]d 1. Supervision of normal first pregnancy, antepartum -Anticipatory guidance for upcoming appts.  -BP elevated at home.  Will monitor accordingly.   2. HSV-2 seropositive -Will start medication at 36 weeks  3. Postural dizziness with near syncope -Encouraged to keep scheduled cardiology appt.  -Instructed to discontinue usage of glucose tablets. -Reviewed usage of juice or foods for management of suspected hypoglycemia.  -Educated on how protein rich nutritional intake can decrease incidents of dizziness.  4. Anemia during pregnancy in third trimester -Will obtain repeat CBC at 36 week visit if no labs collected by cardiology. -Instructed to take iron supplement daily instead of QOD.  Preterm labor symptoms and general obstetric precautions including but not limited to vaginal bleeding, contractions, leaking of fluid and fetal movement were reviewed in detail with the patient. I discussed the assessment and treatment plan with the patient. The patient was provided an opportunity to ask questions and all were answered. The patient agreed with the plan and demonstrated an understanding of the instructions. The patient was advised to call back or seek an in-person office evaluation/go to [facility]U at [facility] for any urgent or concerning symptoms. Please refer to After Visit Summary for other counseling recommendations.   I provided 10 minutes of face-to-face time during this encounter.  No follow-ups on file.  Future Appointments  Date Time Provider Department Center  05/07/2020  3:30 PM Runell Gess, MD [facility]  05/30/2020  9:30 AM Gerrit Heck, CNM [facility] None     Cherre Robins, CNM Center for [employer], [facility]

## 2020-05-07 ENCOUNTER — Other Ambulatory Visit: Payer: Self-pay

## 2020-05-07 ENCOUNTER — Encounter: Payer: Self-pay | Admitting: Cardiovascular Disease

## 2020-05-07 ENCOUNTER — Ambulatory Visit (INDEPENDENT_AMBULATORY_CARE_PROVIDER_SITE_OTHER): Payer: BC Managed Care – PPO | Admitting: Cardiovascular Disease

## 2020-05-07 VITALS — BP 108/68 | HR 111 | Ht 61.0 in | Wt 123.0 lb

## 2020-05-07 DIAGNOSIS — R55 Syncope and collapse: Secondary | ICD-10-CM | POA: Diagnosis not present

## 2020-05-07 NOTE — Patient Instructions (Signed)
Medication Instructions:  Your physician recommends that you continue on your current medications as directed. Please refer to the Current Medication list given to you today.  *If you need a refill on your cardiac medications before your next appointment, please call your pharmacy*  Follow-Up: At [facility], you and your health needs are our priority.  As part of our continuing mission to provide you with exceptional heart care, we have created designated Provider Care Teams.  These Care Teams include your primary Cardiologist (physician) and Advanced Practice Providers (APPs -  Physician Assistants and Nurse Practitioners) who all work together to provide you with the care you need, when you need it.  We recommend signing up for the patient portal called "MyChart".  Sign up information is provided on this After Visit Summary.  MyChart is used to connect with patients for Virtual Visits (Telemedicine).  Patients are able to view lab/test results, encounter notes, upcoming appointments, etc.  Non-urgent messages can be sent to your provider as well.   To learn more about what you can do with MyChart, go to [email or web address].    Your next appointment:   3-4 month(s)  The format for your next appointment:   In Person  Provider:   You may see Dr. Allyson Sabal or one of the following Advanced Practice Providers on your designated Care Team:    Corine Shelter, PA-C  [place], [place]  Edd Fabian, [place]    Other Instructions

## 2020-05-07 NOTE — Assessment & Plan Note (Signed)
[name] was referred to me by Bobby Rumpf, CNM for vasovagal syncope.  She had syncope as a young child as well as during her college years.  Last episode was approximately 8 years ago.  Over the last month or so she has had increased frequency of symptoms compatible with vasovagal syncope with episodes of feeling hot, dizzy.  These can occur at any time.  Symptoms last for minutes at a time but no longer than 30 minutes.  She also complains of occasional palpitations.  She is [redacted] weeks pregnant.  At this point, I prefer to defer evaluation until after she gives birth.  I will see her back in 3 to 4 months.  If she continues to have episodes we will put on an event monitor and get a 2D echocardiogram.

## 2020-05-07 NOTE — Progress Notes (Signed)
05/07/2020 Daneshia Tavano   1991-07-25  [id number]  Primary Physician Patient, No Pcp Per Primary Cardiologist: Runell Gess MD Nicholes Calamity, [place]  HPI:  [name] is a 29 y.o. single African-American female who is [redacted] weeks pregnant and was referred to me by Clayton Bibles, CNM for evaluation of vasovagal syncope.  She apparently had episodes as a young child as well as in college.  The last episode she had prior to her pregnancy was in college approximately 7 or 8 years ago.  She is currently [redacted] weeks pregnant and otherwise healthy.  She is anemic but is being treated with iron replacement therapy.  Over the last month or so she has had several episodes of what sounds like vasovagal syncope where she gets hot and dizzy.  The symptoms can last minutes at a time but no longer than 30 minutes.  She was seen in the ER on 04/11/2020.  She was found to be anemic.  She is chronically low blood pressure.  There is nothing that provokes the symptoms.   No outpatient medications have been marked as taking for the 05/07/20 encounter (Office Visit) with Runell Gess, MD.     No Known Allergies  Social History   Socioeconomic History  . Marital status: Single    Spouse name: Not on file  . Number of children: Not on file  . Years of education: Not on file  . Highest education level: Not on file  Occupational History  . Not on file  Tobacco Use  . Smoking status: Never Smoker  . Smokeless tobacco: Never Used  Vaping Use  . Vaping Use: Never used  Substance and Sexual Activity  . Alcohol use: Not Currently  . Drug use: No  . Sexual activity: Yes    Birth control/protection: None  Other Topics Concern  . Not on file  Social History Narrative  . Not on file   Social Determinants of Health   Financial Resource Strain:   . Difficulty of Paying Living Expenses:   Food Insecurity:   . Worried About Programme researcher, broadcasting/film/video in the Last Year:   . Barista in the  Last Year:   Transportation Needs:   . Freight forwarder (Medical):   Marland Kitchen Lack of Transportation (Non-Medical):   Physical Activity:   . Days of Exercise per Week:   . Minutes of Exercise per Session:   Stress:   . Feeling of Stress :   Social Connections:   . Frequency of Communication with Friends and Family:   . Frequency of Social Gatherings with Friends and Family:   . Attends Religious Services:   . Active Member of Clubs or Organizations:   . Attends Banker Meetings:   Marland Kitchen Marital Status:   Intimate Partner Violence:   . Fear of Current or Ex-Partner:   . Emotionally Abused:   Marland Kitchen Physically Abused:   . Sexually Abused:      Review of Systems: General: negative for chills, fever, night sweats or weight changes.  Cardiovascular: negative for chest pain, dyspnea on exertion, edema, orthopnea, palpitations, paroxysmal nocturnal dyspnea or shortness of breath Dermatological: negative for rash Respiratory: negative for cough or wheezing Urologic: negative for hematuria Abdominal: negative for nausea, vomiting, diarrhea, bright red blood per rectum, melena, or hematemesis Neurologic: negative for visual changes, syncope, or dizziness All other systems reviewed and are otherwise negative except as noted above.    Blood pressure  108/68, pulse (!) 111, height 5\' 1"  (1.549 m), weight 123 lb (55.8 kg), last menstrual period 08/13/2019, SpO2 98 %.  General appearance: alert and no distress Neck: no adenopathy, no carotid bruit, no JVD, supple, symmetrical, trachea midline and thyroid not enlarged, symmetric, no tenderness/mass/nodules Lungs: clear to auscultation bilaterally Heart: regular rate and rhythm, S1, S2 normal, no murmur, click, rub or gallop Extremities: extremities normal, atraumatic, no cyanosis or edema Pulses: 2+ and symmetric Skin: Skin color, texture, turgor normal. No rashes or lesions Neurologic: Alert and oriented X 3, normal strength and tone.  Normal symmetric reflexes. Normal coordination and gait  EKG sinus tachycardia at 111 with a rightward axis and nonspecific ST and T wave changes.  I personally reviewed this EKG.  ASSESSMENT AND PLAN:   Vasovagal syncope Ms. Doan was referred to me by Larinda Buttery, CNM for vasovagal syncope.  She had syncope as a young child as well as during her college years.  Last episode was approximately 8 years ago.  Over the last month or so she has had increased frequency of symptoms compatible with vasovagal syncope with episodes of feeling hot, dizzy.  These can occur at any time.  Symptoms last for minutes at a time but no longer than 30 minutes.  She also complains of occasional palpitations.  She is [redacted] weeks pregnant.  At this point, I prefer to defer evaluation until after she gives birth.  I will see her back in 3 to 4 months.  If she continues to have episodes we will put on an event monitor and get a 2D echocardiogram.      Bobby Rumpf MD [facility], [facility] 05/07/2020 4:28 PM

## 2020-05-25 ENCOUNTER — Other Ambulatory Visit: Payer: Self-pay

## 2020-05-25 ENCOUNTER — Inpatient Hospital Stay (HOSPITAL_COMMUNITY)
Admission: AD | Admit: 2020-05-25 | Discharge: 2020-05-26 | Disposition: A | Discharge status: Home / Self Care | Payer: BC Managed Care – PPO | Attending: Obstetrics and Gynecology | Admitting: Obstetrics and Gynecology

## 2020-05-25 ENCOUNTER — Encounter (HOSPITAL_COMMUNITY): Payer: Self-pay | Admitting: Obstetrics and Gynecology

## 2020-05-25 DIAGNOSIS — Z3A36 36 weeks gestation of pregnancy: Secondary | ICD-10-CM | POA: Insufficient documentation

## 2020-05-25 DIAGNOSIS — O26893 Other specified pregnancy related conditions, third trimester: Secondary | ICD-10-CM | POA: Insufficient documentation

## 2020-05-25 DIAGNOSIS — O23593 Infection of other part of genital tract in pregnancy, third trimester: Secondary | ICD-10-CM | POA: Insufficient documentation

## 2020-05-25 DIAGNOSIS — Z3689 Encounter for other specified antenatal screening: Secondary | ICD-10-CM | POA: Insufficient documentation

## 2020-05-25 DIAGNOSIS — O4703 False labor before 37 completed weeks of gestation, third trimester: Secondary | ICD-10-CM | POA: Insufficient documentation

## 2020-05-25 DIAGNOSIS — B9689 Other specified bacterial agents as the cause of diseases classified elsewhere: Secondary | ICD-10-CM | POA: Insufficient documentation

## 2020-05-25 DIAGNOSIS — R109 Unspecified abdominal pain: Secondary | ICD-10-CM | POA: Insufficient documentation

## 2020-05-25 DIAGNOSIS — Z79899 Other long term (current) drug therapy: Secondary | ICD-10-CM | POA: Insufficient documentation

## 2020-05-25 NOTE — MAU Note (Signed)
Keyonna Comunale is a 29 y.o. at [redacted]w[redacted]d here in MAU reporting: LOF and slight cramping  Onset of complaint: 11am had trickle then later in the day very wet/ cloudy in color Pain score: 2 There were no vitals filed for this visit.   FHT:143 Lab orders placed from triage:

## 2020-05-26 DIAGNOSIS — O23593 Infection of other part of genital tract in pregnancy, third trimester: Secondary | ICD-10-CM | POA: Diagnosis not present

## 2020-05-26 DIAGNOSIS — O4703 False labor before 37 completed weeks of gestation, third trimester: Secondary | ICD-10-CM | POA: Diagnosis present

## 2020-05-26 DIAGNOSIS — N760 Acute vaginitis: Secondary | ICD-10-CM

## 2020-05-26 DIAGNOSIS — B9689 Other specified bacterial agents as the cause of diseases classified elsewhere: Secondary | ICD-10-CM | POA: Diagnosis not present

## 2020-05-26 DIAGNOSIS — O26891 Other specified pregnancy related conditions, first trimester: Secondary | ICD-10-CM | POA: Diagnosis not present

## 2020-05-26 DIAGNOSIS — O26893 Other specified pregnancy related conditions, third trimester: Secondary | ICD-10-CM | POA: Diagnosis not present

## 2020-05-26 DIAGNOSIS — Z3A36 36 weeks gestation of pregnancy: Secondary | ICD-10-CM

## 2020-05-26 DIAGNOSIS — Z79899 Other long term (current) drug therapy: Secondary | ICD-10-CM | POA: Diagnosis not present

## 2020-05-26 DIAGNOSIS — Z3689 Encounter for other specified antenatal screening: Secondary | ICD-10-CM | POA: Diagnosis not present

## 2020-05-26 DIAGNOSIS — R109 Unspecified abdominal pain: Secondary | ICD-10-CM | POA: Diagnosis not present

## 2020-05-26 LAB — WET PREP, GENITAL
Sperm: NONE SEEN
Trich, Wet Prep: NONE SEEN
Yeast Wet Prep HPF POC: NONE SEEN

## 2020-05-26 LAB — POCT FERN TEST: POCT Fern Test: NEGATIVE

## 2020-05-26 MED ORDER — ONDANSETRON 4 MG PO TBDP
4.0000 mg | ORAL_TABLET | Freq: Three times a day (TID) | ORAL | 0 refills | Status: DC | PRN
Start: 2020-05-26 — End: 2020-06-29

## 2020-05-26 MED ORDER — METRONIDAZOLE 500 MG PO TABS
500.0000 mg | ORAL_TABLET | Freq: Two times a day (BID) | ORAL | 0 refills | Status: DC
Start: 2020-05-26 — End: 2020-06-26

## 2020-05-26 NOTE — Discharge Instructions (Signed)

## 2020-05-26 NOTE — MAU Provider Note (Signed)
History   [id number]   Chief Complaint  Patient presents with  . Rupture of Membranes  . Contractions    HPI [name] is a 29 y.o. female  G1P0 @36 .1 wks here with report of leaking clear fluid all day. She had more fluid that saturated her clothes around 9pm. Denies vaginal itching or malodor. Pt reports no contractions just some cramping. She denies vaginal bleeding. Last intercourse was not recent. She reports good fetal movement. All other systems negative.    Patient's last menstrual period was 08/13/2019.  OB History  Gravida Para Term Preterm AB Living  1            SAB TAB Ectopic Multiple Live Births               # Outcome Date GA Lbr Len/2nd Weight Sex Delivery Anes PTL Lv  1 Current             Past Medical History:  Diagnosis Date  . Anxiety   . Costochondritis   . HSV-2 infection     Family History  Problem Relation Age of Onset  . Ovarian cancer Paternal Aunt   . Diabetes Maternal Grandmother     Social History   Socioeconomic History  . Marital status: Single    Spouse name: Not on file  . Number of children: Not on file  . Years of education: Not on file  . Highest education level: Not on file  Occupational History  . Not on file  Tobacco Use  . Smoking status: Never Smoker  . Smokeless tobacco: Never Used  Vaping Use  . Vaping Use: Never used  Substance and Sexual Activity  . Alcohol use: Not Currently  . Drug use: No  . Sexual activity: Yes    Birth control/protection: None  Other Topics Concern  . Not on file  Social History Narrative  . Not on file   Social Determinants of Health   Financial Resource Strain:   . Difficulty of Paying Living Expenses: Not on file  Food Insecurity:   . Worried About 13/06/2019 in the Last Year: Not on file  . Ran Out of Food in the Last Year: Not on file  Transportation Needs:   . Lack of Transportation (Medical): Not on file  . Lack of Transportation (Non-Medical): Not on  file  Physical Activity:   . Days of Exercise per Week: Not on file  . Minutes of Exercise per Session: Not on file  Stress:   . Feeling of Stress : Not on file  Social Connections:   . Frequency of Communication with Friends and Family: Not on file  . Frequency of Social Gatherings with Friends and Family: Not on file  . Attends Religious Services: Not on file  . Active Member of Clubs or Organizations: Not on file  . Attends Programme researcher, broadcasting/film/video Meetings: Not on file  . Marital Status: Not on file    No Known Allergies  No current facility-administered medications on file prior to encounter.   Current Outpatient Medications on File Prior to Encounter  Medication Sig Dispense Refill  . Prenatal Vit-Fe Fumarate-FA (MULTIVITAMIN-PRENATAL) 27-0.8 MG TABS tablet Take 1 tablet by mouth daily at 12 noon.    . Blood Pressure Monitoring (BLOOD PRESSURE MONITOR AUTOMAT) DEVI 1 Device by Does not apply route daily. Automatic blood pressure cuff regular size. To monitor blood pressure regularly at home. ICD-10 code: O09.90 1 each 0  . docusate  sodium (COLACE) 100 MG capsule Take 1 capsule (100 mg total) by mouth 2 (two) times daily as needed. (Patient not taking: Reported on 05/02/2020) 30 capsule 2  . ferrous sulfate 325 (65 FE) MG tablet Take 1 tablet (325 mg total) by mouth every other day. 45 tablet 0  . Misc. Devices (GOJJI WEIGHT SCALE) MISC 1 Device by Does not apply route daily as needed. To weight self daily as needed at home. ICD-10 code: O09.90 1 each 0  . polyethylene glycol powder (GLYCOLAX/MIRALAX) 17 GM/SCOOP powder Take 17 g by mouth daily. (Patient not taking: Reported on 05/02/2020) 255 g 0     Review of Systems  Gastrointestinal: Positive for abdominal pain.  Genitourinary: Positive for vaginal discharge. Negative for vaginal bleeding.     Physical Exam   Vitals:   05/25/20 2335 05/26/20 0051  BP: 112/79 110/74  Pulse: (!) 113 87  Resp: 18   Temp: 98.6 F (37 C)    TempSrc: Oral   SpO2: 99%   Weight: 57.8 kg     Physical Exam Vitals and nursing note reviewed. Exam conducted with a chaperone present.  Constitutional:      General: She is not in acute distress. HENT:     Head: Normocephalic and atraumatic.  Cardiovascular:     Rate and Rhythm: Normal rate.  Pulmonary:     Effort: Pulmonary effort is normal. No respiratory distress.  Abdominal:     Palpations: Abdomen is soft.     Tenderness: There is no abdominal tenderness.  Genitourinary:    Comments: SSE: no pool, fern neg; large amt thin milky discharge Musculoskeletal:        General: Normal range of motion.  Skin:    General: Skin is warm and dry.  Neurological:     General: No focal deficit present.     Mental Status: She is alert and oriented to person, place, and time.  Psychiatric:        Mood and Affect: Mood normal.   EFM: 140 bpm, mod variability, + accels, no decels Toco: irritability  Results for orders placed or performed during the hospital encounter of 05/25/20 (from the past 24 hour(s))  Wet prep, genital     Status: Abnormal   Collection Time: 05/26/20 12:22 AM  Result Value Ref Range   Yeast Wet Prep HPF POC NONE SEEN NONE SEEN   Trich, Wet Prep NONE SEEN NONE SEEN   Clue Cells Wet Prep HPF POC PRESENT (A) NONE SEEN   WBC, Wet Prep HPF POC MANY (A) NONE SEEN   Sperm NONE SEEN   POCT fern test     Status: Normal   Collection Time: 05/26/20 12:25 AM  Result Value Ref Range   POCT Fern Test Negative = intact amniotic membranes    MAU Course  Procedures  MDM Labs ordered and reviewed. No evidence of PTL or SROM. Will treat BV. Stable for discharge home.   Assessment and Plan   1. [redacted] weeks gestation of pregnancy   2. NST (non-stress test) reactive   3. Bacterial vaginosis    Discharge home [facility] at [facility] as scheduled PTL precautions  Allergies as of 05/26/2020   No Known Allergies     Medication List    STOP taking these medications    docusate sodium 100 MG capsule Commonly known as: COLACE   polyethylene glycol powder 17 GM/SCOOP powder Commonly known as: GLYCOLAX/MIRALAX     TAKE these medications   Blood Pressure  Monitor Automat Devi 1 Device by Does not apply route daily. Automatic blood pressure cuff regular size. To monitor blood pressure regularly at home. ICD-10 code: O09.90   ferrous sulfate 325 (65 FE) MG tablet Take 1 tablet (325 mg total) by mouth every other day.   Gojji Weight Scale Misc 1 Device by Does not apply route daily as needed. To weight self daily as needed at home. ICD-10 code: O09.90   metroNIDAZOLE 500 MG tablet Commonly known as: FLAGYL Take 1 tablet (500 mg total) by mouth 2 (two) times daily.   multivitamin-prenatal 27-0.8 MG Tabs tablet Take 1 tablet by mouth daily at 12 noon.   ondansetron 4 MG disintegrating tablet Commonly known as: Zofran ODT Take 1 tablet (4 mg total) by mouth every 8 (eight) hours as needed for nausea or vomiting.      Donette Larry, CNM 05/26/2020 12:55 AM

## 2020-05-30 ENCOUNTER — Other Ambulatory Visit: Payer: Self-pay

## 2020-05-30 ENCOUNTER — Other Ambulatory Visit (HOSPITAL_COMMUNITY)
Admission: RE | Admit: 2020-05-30 | Discharge: 2020-05-30 | Disposition: A | Discharge status: Home / Self Care | Payer: BC Managed Care – PPO | Source: Ambulatory Visit

## 2020-05-30 ENCOUNTER — Ambulatory Visit (INDEPENDENT_AMBULATORY_CARE_PROVIDER_SITE_OTHER): Payer: BC Managed Care – PPO

## 2020-05-30 VITALS — BP 123/82 | HR 114 | Temp 98.2°F | Wt 127.8 lb

## 2020-05-30 DIAGNOSIS — Z3403 Encounter for supervision of normal first pregnancy, third trimester: Secondary | ICD-10-CM | POA: Diagnosis not present

## 2020-05-30 DIAGNOSIS — Z3400 Encounter for supervision of normal first pregnancy, unspecified trimester: Secondary | ICD-10-CM

## 2020-05-30 DIAGNOSIS — B9689 Other specified bacterial agents as the cause of diseases classified elsewhere: Secondary | ICD-10-CM

## 2020-05-30 DIAGNOSIS — N760 Acute vaginitis: Secondary | ICD-10-CM

## 2020-05-30 DIAGNOSIS — R768 Other specified abnormal immunological findings in serum: Secondary | ICD-10-CM

## 2020-05-30 DIAGNOSIS — Z3A36 36 weeks gestation of pregnancy: Secondary | ICD-10-CM

## 2020-05-30 MED ORDER — VALACYCLOVIR HCL 500 MG PO TABS
500.0000 mg | ORAL_TABLET | Freq: Two times a day (BID) | ORAL | 1 refills | Status: DC
Start: 2020-05-30 — End: 2021-07-13

## 2020-05-30 NOTE — Progress Notes (Signed)
Marland Kitchen    LOW-RISK PREGNANCY OFFICE VISIT  Patient name: [name] MRN [id number]  Date of birth: [birth year] Chief Complaint:   Routine Prenatal Visit  Subjective:   [name] is a 29 y.o. G1P0 female at [redacted]w[redacted]d with an Estimated Date of Delivery: 06/22/20 being seen today for ongoing management of a low-risk pregnancy aeb has Supervision of normal first pregnancy, antepartum; HSV-2 seropositive; and Vasovagal syncope on their problem list.  Patient presents today with, FOB Maisie Fus.  She has complaint of pelvic pain and discomfort. Patient endorses fetal movement and denies vaginal concerns including abnormal discharge, leaking of fluid, and bleeding.  Contractions: Irregular. Vag. Bleeding: None.  Movement: Present.  Reviewed past medical,surgical, social, obstetrical and family history as well as problem list, medications and allergies.  Objective   Vitals:   05/30/20 0935  BP: 123/82  Pulse: (!) 114  Temp: 98.2 F (36.8 C)  Weight: 127 lb 12.8 oz (58 kg)  Body mass index is 24.15 kg/m.  Total Weight Gain:26 lb 12.8 oz (12.2 kg)         Physical Examination:   General appearance: Well appearing, and in no distress  Mental status: Alert, oriented to person, place, and time  Skin: Warm & dry  Cardiovascular: Normal heart rate noted  Respiratory: Normal respiratory effort, no distress  Abdomen: Soft, gravid, nontender, AGA with Fundal height of Fundal Height: 37 cm  Pelvic: Cervical exam performed  Dilation: Closed Effacement (%): 0 Station: Ballotable Presentation: Undeterminable  Extremities: Edema: None  Fetal Status: Fetal Heart Rate (bpm): 151  Movement: Present   No results found for this or any previous visit (from the past 24 hour(s)).  Assessment & Plan:  Low-risk pregnancy of a 29 y.o., G1P0 at [redacted]w[redacted]d with an Estimated Date of Delivery: 06/22/20   1. Supervision of normal first pregnancy, antepartum -Labs as below. -Educated on GBS bacteria including what it  is, why we test, and how and when we treat if needed. -Discussed releasing of results to mychart. -Discussed and reviewed postpartum planning including contraception, pediatricians, and infant feedings and circumcision desires/costs. -Discussed how first delivery can go beyond due date and induction may be necessary and not uncommon. -Informed that provider does not know who will be present for delivery, but that patient can request to have a [facility] provider do the delivery if they are in the building.  -Anticipatory guidance for upcoming appts. -Instructed to give FMLA paperwork to front desk staff.   2. Bacterial vaginosis -Taking medication as prescribed.  3. HSV-2 seropositive -Rx for valtrex sent to pharmacy. -Patient instructed to start today.     Meds:  Meds ordered this encounter  Medications  . valACYclovir (VALTREX) 500 MG tablet    Sig: Take 1 tablet (500 mg total) by mouth 2 (two) times daily.    Dispense:  60 tablet    Refill:  1    Order Specific Question:   Supervising Provider    Answer:   Reva Bores [[id number]]   Labs/procedures today:  Lab Orders     Culture, beta strep (group b only)   Reviewed: Term labor symptoms and general obstetric precautions including but not limited to vaginal bleeding, contractions, leaking of fluid and fetal movement were reviewed in detail with the patient.  All questions were answered.  Follow-up: Return in about 1 week (around 06/06/2020) for LROB.  Orders Placed This Encounter  Procedures  . Culture, beta strep (group b only)   Cherre Robins MSN,  CNM 05/31/2020

## 2020-05-30 NOTE — Patient Instructions (Signed)

## 2020-06-02 LAB — CERVICOVAGINAL ANCILLARY ONLY
Chlamydia: NEGATIVE
Comment: NEGATIVE
Comment: NEGATIVE
Comment: NORMAL
Neisseria Gonorrhea: NEGATIVE
Trichomonas: NEGATIVE

## 2020-06-04 LAB — CULTURE, BETA STREP (GROUP B ONLY): Strep Gp B Culture: NEGATIVE

## 2020-06-06 ENCOUNTER — Other Ambulatory Visit: Payer: Self-pay

## 2020-06-06 ENCOUNTER — Ambulatory Visit (INDEPENDENT_AMBULATORY_CARE_PROVIDER_SITE_OTHER): Payer: BC Managed Care – PPO | Admitting: Certified Nurse Midwife

## 2020-06-06 VITALS — BP 116/76 | HR 101 | Temp 97.8°F | Wt 129.2 lb

## 2020-06-06 DIAGNOSIS — Z3A37 37 weeks gestation of pregnancy: Secondary | ICD-10-CM

## 2020-06-06 DIAGNOSIS — Z3403 Encounter for supervision of normal first pregnancy, third trimester: Secondary | ICD-10-CM

## 2020-06-06 NOTE — Progress Notes (Signed)
   PRENATAL VISIT NOTE  Subjective:  [name] is a 29 y.o. G1P0 at [redacted]w[redacted]d being seen today for ongoing prenatal care.  She is currently monitored for the following issues for this low-risk pregnancy and has Supervision of normal first pregnancy, antepartum; HSV-2 seropositive; and Vasovagal syncope on their problem list.  Patient reports no complaints, just occasional cramping.  Contractions: Not present. Vag. Bleeding: None.  Movement: Present. Denies leaking of fluid.   The following portions of the patient's history were reviewed and updated as appropriate: allergies, current medications, past family history, past medical history, past social history, past surgical history and problem list.   Objective:   Vitals:   06/06/20 1028  BP: 116/76  Pulse: (!) 101  Temp: 97.8 F (36.6 C)  Weight: 129 lb 3.2 oz (58.6 kg)    Fetal Status: Fetal Heart Rate (bpm): 145 Fundal Height: 38 cm Movement: Present  Presentation: Vertex  General:  Alert, oriented and cooperative. Patient is in no acute distress.  Skin: Skin is warm and dry. No rash noted.   Cardiovascular: Normal heart rate noted  Respiratory: Normal respiratory effort, no problems with respiration noted  Abdomen: Soft, gravid, appropriate for gestational age.  Pain/Pressure: Present     Pelvic: Cervical exam deferred        Extremities: Normal range of motion.  Edema: Trace  Mental Status: Normal mood and affect. Normal behavior. Normal judgment and thought content.   Assessment and Plan:  Pregnancy: G1P0 at [redacted]w[redacted]d 1. Encounter for supervision of normal first pregnancy in third trimester - Pt doing well, only typical end of pregnancy symptoms - Has started Valtrex as prescribed - Still unsure about birth control   2. [redacted] weeks gestation of pregnancy - Gave anticipatory guidance regarding progression of end of pregnancy, mucus plug loss, watery cervical mucus vs water breaking, and bloody show. Also discussed timing of  contractions and how baby needs to be aligned for labor.  - Encouraged her to keep walking, have sex, and stay hydrated  Term labor symptoms and general obstetric precautions including but not limited to vaginal bleeding, contractions, leaking of fluid and fetal movement were reviewed in detail with the patient. Please refer to After Visit Summary for other counseling recommendations.   Return in about 1 week (around 06/13/2020) for VIRTUAL, LOB.  Future Appointments  Date Time Provider Department Center  06/13/2020 11:30 AM Gerrit Heck, CNM [facility] None  06/20/2020  8:10 AM Raelyn Mora, CNM [facility] None  09/03/2020  9:00 AM Runell Gess, MD [facility]   Edd Arbour, CNM, MSN, [facility] 06/06/20 12:04 PM

## 2020-06-13 ENCOUNTER — Telehealth (INDEPENDENT_AMBULATORY_CARE_PROVIDER_SITE_OTHER): Payer: BC Managed Care – PPO

## 2020-06-13 VITALS — BP 123/78 | HR 84 | Temp 84.0°F | Wt 128.0 lb

## 2020-06-13 DIAGNOSIS — O98513 Other viral diseases complicating pregnancy, third trimester: Secondary | ICD-10-CM

## 2020-06-13 DIAGNOSIS — Z3A38 38 weeks gestation of pregnancy: Secondary | ICD-10-CM

## 2020-06-13 DIAGNOSIS — R768 Other specified abnormal immunological findings in serum: Secondary | ICD-10-CM

## 2020-06-13 DIAGNOSIS — Z3400 Encounter for supervision of normal first pregnancy, unspecified trimester: Secondary | ICD-10-CM

## 2020-06-13 DIAGNOSIS — B002 Herpesviral gingivostomatitis and pharyngotonsillitis: Secondary | ICD-10-CM

## 2020-06-13 NOTE — Patient Instructions (Signed)

## 2020-06-13 NOTE — Progress Notes (Signed)
   OBSTETRICS PRENATAL VIRTUAL VISIT ENCOUNTER NOTE  Provider location: Center for [facility]   I connected with [name] on 06/13/20 at 11:30 AM EDT by MyChart Video Encounter at home and verified that I am speaking with the correct person using two identifiers.   I discussed the limitations, risks, security and privacy concerns of performing an evaluation and management service virtually and the availability of in person appointments. I also discussed with the patient that there may be a patient responsible charge related to this service. The patient expressed understanding and agreed to proceed. Subjective:  [name] is a 29 y.o. G1P0 at [redacted]w[redacted]d being seen today for ongoing prenatal care.  She is currently monitored for the following issues for this low-risk pregnancy and has Supervision of normal first pregnancy, antepartum; HSV-2 seropositive; and Vasovagal syncope on their problem list.  Patient reports intermittent cramping. Patient endorses fetal movement and denies vaginal concerns.  She states she is considering IUD for contraception, but is unsure of insertion time.   Contractions: Irregular. Vag. Bleeding: None.  Movement: Present. Denies any leaking of fluid.   The following portions of the patient's history were reviewed and updated as appropriate: allergies, current medications, past family history, past medical history, past social history, past surgical history and problem list.   Objective:   Vitals:   06/13/20 1135  BP: 123/78  Pulse: 84  Temp: (!) 84 F (28.9 C)  Weight: 128 lb (58.1 kg)    Fetal Status:     Movement: Present     General:  Alert, oriented and cooperative. Patient is in no acute distress.  Respiratory: Normal respiratory effort, no problems with respiration noted  Mental Status: Normal mood and affect. Normal behavior. Normal judgment and thought content.  Rest of physical exam deferred due to type of  encounter  Imaging: No results found.  Assessment and Plan:  Pregnancy: G1P0 at [redacted]w[redacted]d 1. Supervision of normal first pregnancy, antepartum -Anticipatory guidance for upcoming appts. -Reviewed IUD insertion post-placental vs post-partum.  Instructed to obtain pamphlets regarding IUD types at next visit. -Discussed IOL at 40+ weeks. -Patient informed cramping sounds like contractions.  Encouraged to monitor and report to MAU for worsening of symptoms.   2. HSV-2 seropositive -Taking valtrex.  3. [redacted] weeks gestation of pregnancy -Concerns addressed. -Plan for IOL at 40+ weeks.  Patient requests induction occur before Sept 22nd.  Term labor symptoms and general obstetric precautions including but not limited to vaginal bleeding, contractions, leaking of fluid and fetal movement were reviewed in detail with the patient. I discussed the assessment and treatment plan with the patient. The patient was provided an opportunity to ask questions and all were answered. The patient agreed with the plan and demonstrated an understanding of the instructions. The patient was advised to call back or seek an in-person office evaluation/go to [facility] at [facility] for any urgent or concerning symptoms. Please refer to After Visit Summary for other counseling recommendations.   I provided 8 minutes of face-to-face time during this encounter.  Return in about 1 week (around 06/20/2020) for LROB.  Future Appointments  Date Time Provider Department Center  06/20/2020  8:10 AM Raelyn Mora, CNM [facility] None  09/03/2020  9:00 AM Runell Gess, MD [facility]    Cherre Robins, CNM Center for [employer], [facility]

## 2020-06-15 ENCOUNTER — Other Ambulatory Visit: Payer: Self-pay

## 2020-06-17 ENCOUNTER — Telehealth (HOSPITAL_COMMUNITY): Payer: Self-pay | Admitting: *Deleted

## 2020-06-17 ENCOUNTER — Encounter (HOSPITAL_COMMUNITY): Payer: Self-pay | Admitting: *Deleted

## 2020-06-17 NOTE — Telephone Encounter (Signed)
Preadmission screen  

## 2020-06-19 ENCOUNTER — Other Ambulatory Visit (HOSPITAL_COMMUNITY): Payer: Self-pay | Admitting: Advanced Practice Midwife

## 2020-06-20 ENCOUNTER — Other Ambulatory Visit: Payer: Self-pay

## 2020-06-20 ENCOUNTER — Ambulatory Visit (INDEPENDENT_AMBULATORY_CARE_PROVIDER_SITE_OTHER): Payer: BC Managed Care – PPO | Admitting: Obstetrics and Gynecology

## 2020-06-20 VITALS — BP 113/90 | HR 113 | Temp 98.1°F | Wt 128.6 lb

## 2020-06-20 DIAGNOSIS — B009 Herpesviral infection, unspecified: Secondary | ICD-10-CM

## 2020-06-20 DIAGNOSIS — Z3403 Encounter for supervision of normal first pregnancy, third trimester: Secondary | ICD-10-CM

## 2020-06-20 DIAGNOSIS — Z3A39 39 weeks gestation of pregnancy: Secondary | ICD-10-CM

## 2020-06-20 DIAGNOSIS — O98513 Other viral diseases complicating pregnancy, third trimester: Secondary | ICD-10-CM

## 2020-06-20 NOTE — Patient Instructions (Signed)

## 2020-06-20 NOTE — Progress Notes (Signed)
   LOW-RISK PREGNANCY OFFICE VISIT Patient name: [name] MRN [id number]  Date of birth: 1991-05-08 Chief Complaint:   Routine Prenatal Visit  History of Present Illness:   [name] is a 29 y.o. G1P0 female at [redacted]w[redacted]d with an Estimated Date of Delivery: 06/22/20 being seen today for ongoing management of a low-risk pregnancy.  Today she reports occasional contractions and some pelvic pressure. Contractions: Irregular. Vag. Bleeding: None.  Movement: Present. denies leaking of fluid. Review of Systems:   Pertinent items are noted in HPI Denies abnormal vaginal discharge w/ itching/odor/irritation, headaches, visual changes, shortness of breath, chest pain, abdominal pain, severe nausea/vomiting, or problems with urination or bowel movements unless otherwise stated above. Pertinent History Reviewed:  Reviewed past medical,surgical, social, obstetrical and family history.  Reviewed problem list, medications and allergies. Physical Assessment:   Vitals:   06/20/20 0815  BP: 113/90  Pulse: (!) 113  Temp: 98.1 F (36.7 C)  Weight: 128 lb 9.6 oz (58.3 kg)  Body mass index is 24.3 kg/m.        Physical Examination:   General appearance: Well appearing, and in no distress  Mental status: Alert, oriented to person, place, and time  Skin: Warm & dry  Cardiovascular: Normal heart rate noted  Respiratory: Normal respiratory effort, no distress  Abdomen: Soft, gravid, nontender  Pelvic: Cervical exam performed  Dilation: Closed Effacement (%): Thick Station: Ballotable  Extremities: Edema: Trace  Fetal Status: Fetal Heart Rate (bpm): 140 Fundal Height: 40 cm Movement: Present Presentation: Vertex  No results found for this or any previous visit (from the past 24 hour(s)).  Assessment & Plan:  1) Low-risk pregnancy G1P0 at [redacted]w[redacted]d with an Estimated Date of Delivery: 06/22/20   2) Encounter for supervision of normal first pregnancy in third trimester - IOL scheduled for 06/23/20  midnight - Not a candidate for FB insertion - Advised to just show up at 2345 for midnight IOL  3) HSV-2 infection complicating pregnancy, third trimester - Taking daily Valtrex  4) [redacted] weeks gestation of pregnancy     Meds: No orders of the defined types were placed in this encounter.  Labs/procedures today: cervical check  Plan:  Continue routine obstetrical care   Reviewed: Term labor symptoms and general obstetric precautions including but not limited to vaginal bleeding, contractions, leaking of fluid and fetal movement were reviewed in detail with the patient.  All questions were answered. Has home bp cuff. Check bp weekly, let us know if >140/90.   Follow-up: No follow-ups on file.  No orders of the defined types were placed in this encounter.  Raelyn Mora MSN, CNM 06/20/2020 8:44 AM

## 2020-06-21 ENCOUNTER — Other Ambulatory Visit (HOSPITAL_COMMUNITY)
Admission: RE | Admit: 2020-06-21 | Discharge: 2020-06-21 | Disposition: A | Discharge status: Home / Self Care | Payer: BC Managed Care – PPO | Source: Ambulatory Visit | Attending: Obstetrics and Gynecology | Admitting: Obstetrics and Gynecology

## 2020-06-21 DIAGNOSIS — Z01812 Encounter for preprocedural laboratory examination: Secondary | ICD-10-CM | POA: Insufficient documentation

## 2020-06-21 DIAGNOSIS — Z20822 Contact with and (suspected) exposure to covid-19: Secondary | ICD-10-CM | POA: Insufficient documentation

## 2020-06-21 LAB — SARS CORONAVIRUS 2 (TAT 6-24 HRS): SARS Coronavirus 2: NEGATIVE

## 2020-06-23 ENCOUNTER — Inpatient Hospital Stay (HOSPITAL_COMMUNITY)
Admission: AD | Admit: 2020-06-23 | Discharge: 2020-06-26 | DRG: 786 | Disposition: A | Discharge status: Home / Self Care | Payer: BC Managed Care – PPO | Attending: Obstetrics & Gynecology | Admitting: Obstetrics & Gynecology

## 2020-06-23 ENCOUNTER — Other Ambulatory Visit: Payer: Self-pay

## 2020-06-23 ENCOUNTER — Inpatient Hospital Stay (HOSPITAL_COMMUNITY): Payer: BC Managed Care – PPO | Admitting: Anesthesiology

## 2020-06-23 ENCOUNTER — Inpatient Hospital Stay (HOSPITAL_COMMUNITY): Payer: BC Managed Care – PPO

## 2020-06-23 ENCOUNTER — Encounter (HOSPITAL_COMMUNITY): Payer: Self-pay | Admitting: Family Medicine

## 2020-06-23 DIAGNOSIS — O9902 Anemia complicating childbirth: Secondary | ICD-10-CM | POA: Diagnosis present

## 2020-06-23 DIAGNOSIS — D509 Iron deficiency anemia, unspecified: Secondary | ICD-10-CM | POA: Diagnosis present

## 2020-06-23 DIAGNOSIS — N179 Acute kidney failure, unspecified: Secondary | ICD-10-CM | POA: Diagnosis not present

## 2020-06-23 DIAGNOSIS — Z01812 Encounter for preprocedural laboratory examination: Secondary | ICD-10-CM | POA: Diagnosis not present

## 2020-06-23 DIAGNOSIS — Z3A40 40 weeks gestation of pregnancy: Secondary | ICD-10-CM | POA: Diagnosis not present

## 2020-06-23 DIAGNOSIS — O9832 Other infections with a predominantly sexual mode of transmission complicating childbirth: Secondary | ICD-10-CM | POA: Diagnosis present

## 2020-06-23 DIAGNOSIS — A6000 Herpesviral infection of urogenital system, unspecified: Secondary | ICD-10-CM | POA: Diagnosis present

## 2020-06-23 DIAGNOSIS — O480 Post-term pregnancy: Secondary | ICD-10-CM | POA: Diagnosis present

## 2020-06-23 DIAGNOSIS — O904 Postpartum acute kidney failure: Secondary | ICD-10-CM | POA: Diagnosis not present

## 2020-06-23 DIAGNOSIS — R768 Other specified abnormal immunological findings in serum: Secondary | ICD-10-CM | POA: Diagnosis present

## 2020-06-23 DIAGNOSIS — Z20822 Contact with and (suspected) exposure to covid-19: Secondary | ICD-10-CM | POA: Diagnosis present

## 2020-06-23 DIAGNOSIS — O324XX0 Maternal care for high head at term, not applicable or unspecified: Secondary | ICD-10-CM | POA: Diagnosis not present

## 2020-06-23 LAB — COMPREHENSIVE METABOLIC PANEL
ALT: 9 U/L (ref 0–44)
AST: 21 U/L (ref 15–41)
Albumin: 2.6 g/dL — ABNORMAL LOW (ref 3.5–5.0)
Alkaline Phosphatase: 215 U/L — ABNORMAL HIGH (ref 38–126)
Anion gap: 11 (ref 5–15)
BUN: 8 mg/dL (ref 6–20)
CO2: 21 mmol/L — ABNORMAL LOW (ref 22–32)
Calcium: 9 mg/dL (ref 8.9–10.3)
Chloride: 105 mmol/L (ref 98–111)
Creatinine, Ser: 1.15 mg/dL — ABNORMAL HIGH (ref 0.44–1.00)
GFR calc Af Amer: >60 mL/min (ref >60)
GFR calc non Af Amer: >60 mL/min (ref >60)
Glucose, Bld: 85 mg/dL (ref 70–99)
Potassium: 4.6 mmol/L (ref 3.5–5.1)
Sodium: 137 mmol/L (ref 135–145)
Total Bilirubin: 0.5 mg/dL (ref 0.3–1.2)
Total Protein: 5.9 g/dL — ABNORMAL LOW (ref 6.5–8.1)

## 2020-06-23 LAB — CBC
HCT: 40.2 % (ref 36.0–46.0)
Hemoglobin: 13.4 g/dL (ref 12.0–15.0)
MCH: 29.9 pg (ref 26.0–34.0)
MCHC: 33.3 g/dL (ref 30.0–36.0)
MCV: 89.7 fL (ref 80.0–100.0)
Platelets: 274 10*3/uL (ref 150–400)
RBC: 4.48 MIL/uL (ref 3.87–5.11)
RDW: 15.3 % (ref 11.5–15.5)
WBC: 7.2 10*3/uL (ref 4.0–10.5)
nRBC: 0 % (ref 0.0–0.2)

## 2020-06-23 LAB — RPR: RPR Ser Ql: NONREACTIVE

## 2020-06-23 LAB — PROTEIN / CREATININE RATIO, URINE
Creatinine, Urine: 53.86 mg/dL
Protein Creatinine Ratio: 2.97 mg/mg{Cre} — ABNORMAL HIGH (ref 0.00–0.15)
Total Protein, Urine: 160 mg/dL

## 2020-06-23 LAB — TYPE AND SCREEN
ABO/RH(D): B POS
Antibody Screen: NEGATIVE

## 2020-06-23 MED ORDER — PHENYLEPHRINE 40 MCG/ML (10ML) SYRINGE FOR IV PUSH (FOR BLOOD PRESSURE SUPPORT): 80.0000 ug | PREFILLED_SYRINGE | INTRAVENOUS | Status: DC | PRN

## 2020-06-23 MED ORDER — SODIUM CHLORIDE (PF) 0.9 % IJ SOLN
INTRAMUSCULAR | Status: DC | PRN
Start: 2020-06-23 — End: 2020-06-24
  Administered 2020-06-23: 12 mL/h via EPIDURAL

## 2020-06-23 MED ORDER — FENTANYL CITRATE (PF) 100 MCG/2ML IJ SOLN
50.0000 ug | INTRAMUSCULAR | Status: DC | PRN
  Administered 2020-06-23 (×3): 50 ug via INTRAVENOUS
  Filled 2020-06-23 (×3): qty 2

## 2020-06-23 MED ORDER — LIDOCAINE HCL (PF) 1 % IJ SOLN
INTRAMUSCULAR | Status: DC | PRN
  Administered 2020-06-23: 10 mL via EPIDURAL
  Administered 2020-06-23: 2 mL via EPIDURAL

## 2020-06-23 MED ORDER — ONDANSETRON HCL 4 MG/2ML IJ SOLN
4.0000 mg | Freq: Four times a day (QID) | INTRAMUSCULAR | Status: DC | PRN
  Administered 2020-06-23 – 2020-06-24 (×2): 4 mg via INTRAVENOUS
  Filled 2020-06-23: qty 2

## 2020-06-23 MED ORDER — LACTATED RINGERS IV SOLN: INTRAVENOUS | Status: DC

## 2020-06-23 MED ORDER — ACETAMINOPHEN 325 MG PO TABS
650.0000 mg | ORAL_TABLET | ORAL | Status: DC | PRN
  Administered 2020-06-23: 650 mg via ORAL
  Filled 2020-06-23: qty 2

## 2020-06-23 MED ORDER — DIPHENHYDRAMINE HCL 50 MG/ML IJ SOLN
25.0000 mg | Freq: Once | INTRAMUSCULAR | Status: AC
  Administered 2020-06-23: 25 mg via INTRAVENOUS
  Filled 2020-06-23: qty 1

## 2020-06-23 MED ORDER — OXYTOCIN BOLUS FROM INFUSION: 333.0000 mL | Freq: Once | INTRAVENOUS | Status: DC

## 2020-06-23 MED ORDER — LACTATED RINGERS IV SOLN
500.0000 mL | Freq: Once | INTRAVENOUS | Status: AC
  Administered 2020-06-23: 500 mL via INTRAVENOUS

## 2020-06-23 MED ORDER — EPHEDRINE 5 MG/ML INJ: 10.0000 mg | INTRAVENOUS | Status: DC | PRN

## 2020-06-23 MED ORDER — LIDOCAINE HCL (PF) 1 % IJ SOLN: 30.0000 mL | INTRAMUSCULAR | Status: DC | PRN

## 2020-06-23 MED ORDER — FUROSEMIDE 10 MG/ML IJ SOLN
40.0000 mg | Freq: Once | INTRAMUSCULAR | Status: AC
  Administered 2020-06-23: 40 mg via INTRAVENOUS
  Filled 2020-06-23: qty 4

## 2020-06-23 MED ORDER — DIPHENHYDRAMINE HCL 50 MG/ML IJ SOLN: 12.5000 mg | INTRAMUSCULAR | Status: DC | PRN

## 2020-06-23 MED ORDER — MISOPROSTOL 50MCG HALF TABLET
50.0000 ug | ORAL_TABLET | Freq: Once | ORAL | Status: AC
  Administered 2020-06-23: 50 ug via BUCCAL
  Filled 2020-06-23: qty 1

## 2020-06-23 MED ORDER — FUROSEMIDE 10 MG/ML IJ SOLN
20.0000 mg | Freq: Once | INTRAMUSCULAR | Status: AC
  Administered 2020-06-23: 20 mg via INTRAVENOUS
  Filled 2020-06-23: qty 2

## 2020-06-23 MED ORDER — FENTANYL-BUPIVACAINE-NACL 0.5-0.125-0.9 MG/250ML-% EP SOLN
12.0000 mL/h | EPIDURAL | Status: DC | PRN
  Filled 2020-06-23: qty 250

## 2020-06-23 MED ORDER — LACTATED RINGERS IV SOLN
500.0000 mL | INTRAVENOUS | Status: DC | PRN
  Administered 2020-06-23 (×2): 500 mL via INTRAVENOUS
  Administered 2020-06-23: 1000 mL via INTRAVENOUS

## 2020-06-23 MED ORDER — SOD CITRATE-CITRIC ACID 500-334 MG/5ML PO SOLN
30.0000 mL | ORAL | Status: DC | PRN
  Filled 2020-06-23: qty 15

## 2020-06-23 MED ORDER — OXYTOCIN-SODIUM CHLORIDE 30-0.9 UT/500ML-% IV SOLN
1.0000 m[IU]/min | INTRAVENOUS | Status: DC
  Administered 2020-06-23: 2 m[IU]/min via INTRAVENOUS
  Filled 2020-06-23: qty 500

## 2020-06-23 MED ORDER — TERBUTALINE SULFATE 1 MG/ML IJ SOLN: 0.2500 mg | Freq: Once | INTRAMUSCULAR | Status: DC | PRN

## 2020-06-23 MED ORDER — OXYTOCIN-SODIUM CHLORIDE 30-0.9 UT/500ML-% IV SOLN
2.5000 [IU]/h | INTRAVENOUS | Status: DC
  Administered 2020-06-24: 30 [IU] via INTRAVENOUS

## 2020-06-23 MED ORDER — MISOPROSTOL 25 MCG QUARTER TABLET: 25.0000 ug | ORAL_TABLET | ORAL | Status: DC | PRN

## 2020-06-23 NOTE — Progress Notes (Signed)
Labor Progress Note [name] is a 29 y.o. G1P0 at [redacted]w[redacted]d presented for IOL-PD. S: Doing well without complaints.  O:  BP 102/67   Pulse 88   Temp 98.9 F (37.2 C)   Resp 18   Ht 5\' 1"  (1.549 m)   Wt 58.6 kg   LMP 08/13/2019   BMI 24.41 kg/m  EFM: 135bpm/mod variability/+ accels/no decels Toco: q1-3 minutes  CVE: Dilation: 4 Effacement (%): 50 Cervical Position: Posterior Station: -1 Presentation: Vertex Exam by:: Dr [email or web address]   A&P: 29 y.o. G1P0 [redacted]w[redacted]d presented for IOL-PD. #Labor: Progressing well. FB out on this exam. Will start pitocin at this time and continue to augment as appropriate. #Pain: PRN #FWB: cat 1 #GBS negative #HSV-2: last outbreak years ago, SSE unremarkable, on valtrex suppression. #Iron deficiency anemia: received ferraheme x2 antepartum. Hgb on admit 9.8. Consider PO iron supplementation post partum. Asymptomatic.   [redacted]w[redacted]d, MD 6:01 AM

## 2020-06-23 NOTE — Progress Notes (Signed)
LABOR PROGRESS NOTE  [name] is a 29 y.o. G1P0 at [redacted]w[redacted]d  admitted for IOL for PD  Subjective: Patient starting to feel more pelvic pressure and back pain with epidural   Objective: BP 132/79   Pulse 73   Temp 99.8 F (37.7 C) (Axillary)   Resp 18   Ht 5\' 1"  (1.549 m)   Wt 58.6 kg   LMP 08/13/2019   SpO2 99%   BMI 24.41 kg/m  or  Vitals:   06/23/20 1651 06/23/20 1700 06/23/20 1730 06/23/20 1800  BP:  118/76 120/73 132/79  Pulse:  67 65 73  Resp:  18 18 18   Temp: 100.1 F (37.8 C)   99.8 F (37.7 C)  TempSrc:    Axillary  SpO2:      Weight:      Height:       Currently on 52milli-unit/min on pitocin  Dilation: Lip/rim Effacement (%): 80 Cervical Position: Posterior Station: 0, Plus 1 Presentation: Vertex Exam by:: 16m CNM FHT: baseline rate 145, minimal to moderate varibility, +accel, no decel MVU: 160  Labs: Lab Results  Component Value Date   WBC 7.2 06/23/2020   HGB 13.4 06/23/2020   HCT 40.2 06/23/2020   MCV 89.7 06/23/2020   PLT 274 06/23/2020    Patient Active Problem List   Diagnosis Date Noted  . Indication for care in labor and delivery, antepartum 06/23/2020  . Vasovagal syncope 05/07/2020  . Supervision of normal first pregnancy, antepartum 11/19/2019  . 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 G1P0 at [redacted]w[redacted]d here for IOL for PD   Labor: Progressing well on  Pitocin, continue to titrate to active labor, patient placed in high fowlers to get rid of anterior lip  Fetal Wellbeing:  Cat I  Pain Control:  Epidural  Anticipated MOD:  SVD  Urine output: minimal to no urine output over the past hour, Foley readjusted behind fetus head. CMP and PCR ordered- Cr 1.15.  Discussed with Dr 26 who recommended IV liter bolus and 20mg  Lasix after IV bolus  PCR pending   [redacted]w[redacted]d, CNM 06/23/2020, 6:26 PM

## 2020-06-23 NOTE — Progress Notes (Signed)
LABOR PROGRESS NOTE  [name] is a 29 y.o. G1P0 at [redacted]w[redacted]d  admitted for IOL for PD   Subjective: Patient comfortable with epidural at this time, resting   Objective: BP 107/71   Pulse 70   Temp 98.2 F (36.8 C) (Axillary)   Resp 18   Ht 5\' 1"  (1.549 m)   Wt 58.6 kg   LMP 08/13/2019   SpO2 99%   BMI 24.41 kg/m  or  Vitals:   06/23/20 1130 06/23/20 1200 06/23/20 1230 06/23/20 1300  BP: 107/78 128/79 127/88 107/71  Pulse: 69 71 76 70  Resp: 18 18 18 18   Temp:      TempSrc:      SpO2:      Weight:      Height:        IUPC placed @1340  Dilation: 8 Effacement (%): 80, 90 Cervical Position: Posterior Station: -1 Presentation: Vertex Exam by:: CNM FHT: baseline rate 135, moderate varibility, +accel, no decel Toco: 2-3  Labs: Lab Results  Component Value Date   WBC 7.2 06/23/2020   HGB 13.4 06/23/2020   HCT 40.2 06/23/2020   MCV 89.7 06/23/2020   PLT 274 06/23/2020    Patient Active Problem List   Diagnosis Date Noted  . Indication for care in labor and delivery, antepartum 06/23/2020  . Vasovagal syncope 05/07/2020  . Supervision of normal first pregnancy, antepartum 11/19/2019  . 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 G1P0 at [redacted]w[redacted]d here for IOL for PD   Labor: Progressing well, continue to titrate pitocin to active labor. Patient positioned in right exaggerated sims with peanut.  Fetal Wellbeing:  Cat I  Pain Control:  Epidural  Anticipated MOD:  SVD  11/21/2019, CNM 06/23/2020, 1:45 PM

## 2020-06-23 NOTE — Anesthesia Preprocedure Evaluation (Addendum)
Anesthesia Evaluation  Patient identified by MRN, date of birth, ID band Patient awake    Reviewed: Allergy & Precautions, NPO status , Patient's Chart, lab work & pertinent test results  Airway Mallampati: I  TM Distance: >3 FB Neck ROM: Full    Dental  (+) Teeth Intact   Pulmonary neg pulmonary ROS,    Pulmonary exam normal breath sounds clear to auscultation       Cardiovascular negative cardio ROS Normal cardiovascular exam Rhythm:Regular Rate:Normal     Neuro/Psych PSYCHIATRIC DISORDERS Anxiety negative neurological ROS     GI/Hepatic negative GI ROS, Neg liver ROS,   Endo/Other  negative endocrine ROS  Renal/GU negative Renal ROS  negative genitourinary   Musculoskeletal negative musculoskeletal ROS (+)   Abdominal   Peds  Hematology negative hematology ROS (+) Plt 274k   Anesthesia Other Findings Day of surgery medications reviewed with the patient.  Reproductive/Obstetrics (+) Pregnancy                            Anesthesia Physical Anesthesia Plan  ASA: II  Anesthesia Plan: Epidural   Post-op Pain Management:    Induction:   PONV Risk Score and Plan: 2 and Treatment may vary due to age or medical condition  Airway Management Planned: Natural Airway  Additional Equipment:   Intra-op Plan:   Post-operative Plan:   Informed Consent: I have reviewed the patients History and Physical, chart, labs and discussed the procedure including the risks, benefits and alternatives for the proposed anesthesia with the patient or authorized representative who has indicated his/her understanding and acceptance.       Plan Discussed with:   Anesthesia Plan Comments: (Patient identified. Risks/Benefits/Options discussed with patient including but not limited to bleeding, infection, nerve damage, paralysis, failed block, incomplete pain control, headache, blood pressure changes,  nausea, vomiting, reactions to medication both or allergic, itching and postpartum back pain. Confirmed with bedside nurse the patient's most recent platelet count. Confirmed with patient that they are not currently taking any anticoagulation, have any bleeding history or any family history of bleeding disorders. Patient expressed understanding and wished to proceed. All questions were answered. )        Anesthesia Quick Evaluation

## 2020-06-23 NOTE — Anesthesia Procedure Notes (Signed)
Epidural Patient location during procedure: OB Start time: 06/23/2020 10:31 AM End time: 06/23/2020 10:40 AM  Staffing Anesthesiologist: Lannie Fields, DO Performed: anesthesiologist   Preanesthetic Checklist Completed: patient identified, IV checked, risks and benefits discussed, monitors and equipment checked, pre-op evaluation and timeout performed  Epidural Patient position: sitting Prep: DuraPrep and site prepped and draped Patient monitoring: continuous pulse ox, blood pressure, heart rate and cardiac monitor Approach: midline Location: L3-L4 Injection technique: LOR air  Needle:  Needle type: Tuohy  Needle gauge: 17 G Needle length: 9 cm Needle insertion depth: 5 cm Catheter type: closed end flexible Catheter size: 19 Gauge Catheter at skin depth: 10 cm Test dose: negative  Assessment Sensory level: T8 Events: blood not aspirated, injection not painful, no injection resistance, no paresthesia and negative IV test  Additional Notes Patient identified. Risks/Benefits/Options discussed with patient including but not limited to bleeding, infection, nerve damage, paralysis, failed block, incomplete pain control, headache, blood pressure changes, nausea, vomiting, reactions to medication both or allergic, itching and postpartum back pain. Confirmed with bedside nurse the patient's most recent platelet count. Confirmed with patient that they are not currently taking any anticoagulation, have any bleeding history or any family history of bleeding disorders. Patient expressed understanding and wished to proceed. All questions were answered. Sterile technique was used throughout the entire procedure. Please see nursing notes for vital signs. Test dose was given through epidural catheter and negative prior to continuing to dose epidural or start infusion. Warning signs of high block given to the patient including shortness of breath, tingling/numbness in hands, complete motor  block, or any concerning symptoms with instructions to call for help. Patient was given instructions on fall risk and not to get out of bed. All questions and concerns addressed with instructions to call with any issues or inadequate analgesia.  Reason for block:procedure for pain

## 2020-06-23 NOTE — H&P (Signed)
OBSTETRIC ADMISSION HISTORY AND PHYSICAL  Annemarie Sebree is a 29 y.o. female G1P0 with IUP at [redacted]w[redacted]d by 2nd trimester u/s presenting for IOL-PD. She reports +FMs, No LOF, no VB, no blurry vision, headaches or peripheral edema, and RUQ pain.  She plans on breast feeding. She request IUD outpatient for birth control. She received her prenatal care at [facility]   Dating: By 2nd trimester u/s --->  Estimated Date of Delivery: 06/22/20  Sono:    01/28/20@[redacted]w[redacted]d , CWD, normal anatomy, cephalic presentation, 304g, 75% EFW   Prenatal History/Complications:  HSV-2 (on valtrex) Iron def anemia (s/p ferraheme x2)   Past Medical History: Past Medical History:  Diagnosis Date  . Anemia   . Anxiety   . Costochondritis   . HSV-2 infection   . Vaginal Pap smear, abnormal     Past Surgical History: Past Surgical History:  Procedure Laterality Date  . COLPOSCOPY     Dr. Dareen Piano at [facility] 1.5-2 years ago    Obstetrical History: OB History    Gravida  1   Para      Term      Preterm      AB      Living        SAB      TAB      Ectopic      Multiple      Live Births              Social History Social History   Socioeconomic History  . Marital status: Single    Spouse name: Not on file  . Number of children: Not on file  . Years of education: Not on file  . Highest education level: Not on file  Occupational History  . Not on file  Tobacco Use  . Smoking status: Never Smoker  . Smokeless tobacco: Never Used  Vaping Use  . Vaping Use: Never used  Substance and Sexual Activity  . Alcohol use: Not Currently  . Drug use: No  . Sexual activity: Yes    Birth control/protection: None  Other Topics Concern  . Not on file  Social History Narrative  . Not on file   Social Determinants of Health   Financial Resource Strain:   . Difficulty of Paying Living Expenses: Not on file  Food Insecurity:   . Worried About Programme researcher, broadcasting/film/video in the Last Year: Not on  file  . Ran Out of Food in the Last Year: Not on file  Transportation Needs:   . Lack of Transportation (Medical): Not on file  . Lack of Transportation (Non-Medical): Not on file  Physical Activity:   . Days of Exercise per Week: Not on file  . Minutes of Exercise per Session: Not on file  Stress:   . Feeling of Stress : Not on file  Social Connections:   . Frequency of Communication with Friends and Family: Not on file  . Frequency of Social Gatherings with Friends and Family: Not on file  . Attends Religious Services: Not on file  . Active Member of Clubs or Organizations: Not on file  . Attends Banker Meetings: Not on file  . Marital Status: Not on file    Family History: Family History  Problem Relation Age of Onset  . Ovarian cancer Paternal Aunt   . Diabetes Maternal Grandmother     Allergies: No Known Allergies  Medications Prior to Admission  Medication Sig Dispense Refill Last Dose  .  Blood Pressure Monitoring (BLOOD PRESSURE MONITOR AUTOMAT) DEVI 1 Device by Does not apply route daily. Automatic blood pressure cuff regular size. To monitor blood pressure regularly at home. ICD-10 code: O09.90 1 each 0   . ferrous sulfate 325 (65 FE) MG tablet Take 1 tablet (325 mg total) by mouth every other day. (Patient not taking: Reported on 05/30/2020) 45 tablet 0   . metroNIDAZOLE (FLAGYL) 500 MG tablet Take 1 tablet (500 mg total) by mouth 2 (two) times daily. (Patient not taking: Reported on 06/06/2020) 14 tablet 0   . Misc. Devices (GOJJI WEIGHT SCALE) MISC 1 Device by Does not apply route daily as needed. To weight self daily as needed at home. ICD-10 code: O09.90 1 each 0   . ondansetron (ZOFRAN ODT) 4 MG disintegrating tablet Take 1 tablet (4 mg total) by mouth every 8 (eight) hours as needed for nausea or vomiting. (Patient not taking: Reported on 05/30/2020) 14 tablet 0   . Prenatal Vit-Fe Fumarate-FA (MULTIVITAMIN-PRENATAL) 27-0.8 MG TABS tablet Take 1 tablet by  mouth daily at 12 noon.     . valACYclovir (VALTREX) 500 MG tablet Take 1 tablet (500 mg total) by mouth 2 (two) times daily. 60 tablet 1      Review of Systems   All systems reviewed and negative except as stated in HPI  Last menstrual period 08/13/2019. General appearance: alert, cooperative and no distress Lungs: normal respiratory effort Heart: regular rate and rhythm Abdomen: soft, non-tender; gravid Pelvic: as noted below Extremities: Homans sign is negative, no sign of DVT Presentation: cephalic on cervical exam Fetal monitoringBaseline: 150 bpm, Variability: Good {> 6 bpm), Accelerations: Reactive and Decelerations: Absent Uterine activity intermittent     Prenatal labs: ABO, Rh: B/Positive/-- (02/25 1003) Antibody: Negative (02/25 1003) Rubella: 2.32 (02/25 1003) RPR: Non Reactive (06/18 0821)  HBsAg: Negative (02/25 1003)  HIV: Non Reactive (06/18 0821)  GBS: Negative/-- (08/27 1026)  2 hr Glucola passed Genetic screening  Low risk Anatomy US normal  Prenatal Transfer Tool  Maternal Diabetes: No Genetic Screening: Normal Maternal Ultrasounds/Referrals: Normal Fetal Ultrasounds or other Referrals:  None Maternal Substance Abuse:  No Significant Maternal Medications:  Meds include: Other: valtrex Significant Maternal Lab Results: None  No results found for this or any previous visit (from the past 24 hour(s)).  Patient Active Problem List   Diagnosis Date Noted  . Vasovagal syncope 05/07/2020  . Supervision of normal first pregnancy, antepartum 11/19/2019  . HSV-2 seropositive 11/19/2019    Assessment/Plan:  Louann Hopson is a 29 y.o. G1P0 at [redacted]w[redacted]d here for IOL-PD.  #Labor: Will initiate IOL with cytotec 50 mcg buccal. FB placed on exam. Continue augmentation as appropriate. #Pain: PRN #FWB: Cat 1 #ID: GBS neg #MOF: breast #MOC: IUD outpatient #Circ: yes #HSV-2: last outbreak years ago, SSE unremarkable, on valtrex suppression. #Iron deficiency  anemia: received ferraheme x2 antepartum. Hgb on admit pending. Asymptomatic.   Alric Seton, MD  06/23/2020, 12:17 AM

## 2020-06-23 NOTE — Progress Notes (Signed)
LABOR PROGRESS NOTE  [name] is a 29 y.o. G1P0 at [redacted]w[redacted]d admitted for IOL for PD   Subjective: Patient doing well, up and moving around from bathroom   Objective: BP 121/80   Pulse 83   Temp 98.6 F (37 C) (Oral)   Resp 18   Ht 5\' 1"  (1.549 m)   Wt 58.6 kg   LMP 08/13/2019   SpO2 99%   BMI 24.41 kg/m  or  Vitals:   06/23/20 1050 06/23/20 1055 06/23/20 1100 06/23/20 1105  BP: 125/78 108/68 115/80 121/80  Pulse: 89 81 75 83  Resp: 18 18 16 18   Temp:      TempSrc:      SpO2:      Weight:      Height:        AROM@0950 , clear fluid   Dilation: 6 Effacement (%): 80, 90 Cervical Position: Posterior Station: -1 Presentation: Vertex Exam by:: CNM FHT: baseline rate 135, moderate varibility, +accel, no decel Toco: 1-3/ moderate by palpation   Labs: Lab Results  Component Value Date   WBC 7.2 06/23/2020   HGB 13.4 06/23/2020   HCT 40.2 06/23/2020   MCV 89.7 06/23/2020   PLT 274 06/23/2020    Patient Active Problem List   Diagnosis Date Noted  . Indication for care in labor and delivery, antepartum 06/23/2020  . Vasovagal syncope 05/07/2020  . Supervision of normal first pregnancy, antepartum 11/19/2019  . 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 G1P0 at [redacted]w[redacted]d here for IOL for PD   Labor: Progressing well, AROM, continue on pitocin, plan to recheck cervix in 2-3 hours for progressing and need for IUPC  Fetal Wellbeing:  Cat I  Pain Control:  IV pain medication, patient does not want epidural  Anticipated MOD:  SVD  26, CNM 06/23/2020, 11:19 AM

## 2020-06-23 NOTE — Progress Notes (Signed)
LABOR PROGRESS NOTE  [name] is a 29 y.o. G1P0 at [redacted]w[redacted]d  admitted for IOL for PD. PEC diagnosed intrapartum- decreased urine output, Cr 1.15, PCR 2.97  Subjective: Patient pushing with contractions   Objective: BP 129/72   Pulse (!) 129   Temp 98.8 F (37.1 C) (Oral)   Resp 17   Ht 5\' 1"  (1.549 m)   Wt 58.6 kg   LMP 08/13/2019   SpO2 99%   BMI 24.41 kg/m  or  Vitals:   06/23/20 1830 06/23/20 1900 06/23/20 1956 06/23/20 2008  BP: 118/86 123/61  129/72  Pulse: 96 96  (!) 129  Resp: 18 18  17   Temp:   98.8 F (37.1 C)   TempSrc:   Oral   SpO2:      Weight:      Height:        Dilation: 10 Dilation Complete Date: 06/23/20 Dilation Complete Time: 1944 Effacement (%): 80 Cervical Position: Posterior Station: Plus 1, Plus 2 Presentation: Vertex Exam by:: 06/25/20, CNM FHT: baseline rate 145, moderate varibility, +accel, variable decel  Labs: Lab Results  Component Value Date   WBC 7.2 06/23/2020   HGB 13.4 06/23/2020   HCT 40.2 06/23/2020   MCV 89.7 06/23/2020   PLT 274 06/23/2020    Patient Active Problem List   Diagnosis Date Noted  . Indication for care in labor and delivery, antepartum 06/23/2020  . Preeclampsia, third trimester 06/23/2020  . Vasovagal syncope 05/07/2020  . Supervision of normal first pregnancy, antepartum 11/19/2019  . 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 G1P0 at [redacted]w[redacted]d here for IOL for PD- PEC dx intrapartum   Labor: Pushing with contractions, expecting delivery  Fetal Wellbeing:  Cat II  Pain Control:  Epidural  Anticipated MOD:  SVD  26, CNM 06/23/2020, 8:13 PM

## 2020-06-24 ENCOUNTER — Encounter (HOSPITAL_COMMUNITY)
Admission: AD | Disposition: A | Discharge status: Home / Self Care | Payer: Self-pay | Source: Home / Self Care | Attending: Obstetrics & Gynecology

## 2020-06-24 ENCOUNTER — Encounter (HOSPITAL_COMMUNITY): Payer: Self-pay | Admitting: Family Medicine

## 2020-06-24 DIAGNOSIS — O480 Post-term pregnancy: Secondary | ICD-10-CM

## 2020-06-24 DIAGNOSIS — O324XX0 Maternal care for high head at term, not applicable or unspecified: Secondary | ICD-10-CM

## 2020-06-24 DIAGNOSIS — Z3A40 40 weeks gestation of pregnancy: Secondary | ICD-10-CM

## 2020-06-24 SURGERY — Surgical Case
Anesthesia: Epidural

## 2020-06-24 MED ORDER — CEFAZOLIN SODIUM-DEXTROSE 2-3 GM-%(50ML) IV SOLR
INTRAVENOUS | Status: DC | PRN
  Administered 2020-06-24: 2 g via INTRAVENOUS

## 2020-06-24 MED ORDER — SODIUM CHLORIDE 0.9 % IV SOLN
3.0000 g | Freq: Four times a day (QID) | INTRAVENOUS | Status: AC
  Administered 2020-06-24 – 2020-06-26 (×8): 3 g via INTRAVENOUS
  Filled 2020-06-24 (×2): qty 3
  Filled 2020-06-24: qty 8
  Filled 2020-06-24: qty 3
  Filled 2020-06-24 (×2): qty 8
  Filled 2020-06-24 (×4): qty 3

## 2020-06-24 MED ORDER — SOD CITRATE-CITRIC ACID 500-334 MG/5ML PO SOLN
30.0000 mL | ORAL | Status: AC
  Administered 2020-06-24: 30 mL via ORAL

## 2020-06-24 MED ORDER — MORPHINE SULFATE (PF) 0.5 MG/ML IJ SOLN
INTRAMUSCULAR | Status: AC
  Filled 2020-06-24: qty 10

## 2020-06-24 MED ORDER — SODIUM CHLORIDE 0.9 % IR SOLN
Status: DC | PRN
  Administered 2020-06-24: 1

## 2020-06-24 MED ORDER — CEFAZOLIN SODIUM-DEXTROSE 2-4 GM/100ML-% IV SOLN: 2.0000 g | INTRAVENOUS | Status: DC

## 2020-06-24 MED ORDER — MEPERIDINE HCL 25 MG/ML IJ SOLN: 6.2500 mg | INTRAMUSCULAR | Status: DC | PRN

## 2020-06-24 MED ORDER — NALOXONE HCL 0.4 MG/ML IJ SOLN: 0.4000 mg | INTRAMUSCULAR | Status: DC | PRN

## 2020-06-24 MED ORDER — ONDANSETRON HCL 4 MG/2ML IJ SOLN
INTRAMUSCULAR | Status: AC
  Filled 2020-06-24: qty 2

## 2020-06-24 MED ORDER — ENOXAPARIN SODIUM 40 MG/0.4ML ~~LOC~~ SOLN
40.0000 mg | SUBCUTANEOUS | Status: DC
  Administered 2020-06-24 – 2020-06-25 (×2): 40 mg via SUBCUTANEOUS
  Filled 2020-06-24 (×2): qty 0.4

## 2020-06-24 MED ORDER — SIMETHICONE 80 MG PO CHEW
80.0000 mg | CHEWABLE_TABLET | Freq: Three times a day (TID) | ORAL | Status: DC
  Administered 2020-06-24 – 2020-06-25 (×3): 80 mg via ORAL
  Filled 2020-06-24 (×4): qty 1

## 2020-06-24 MED ORDER — SCOPOLAMINE 1 MG/3DAYS TD PT72
1.0000 | MEDICATED_PATCH | Freq: Once | TRANSDERMAL | Status: DC
  Administered 2020-06-24: 1.5 mg via TRANSDERMAL

## 2020-06-24 MED ORDER — KETOROLAC TROMETHAMINE 30 MG/ML IJ SOLN
30.0000 mg | Freq: Three times a day (TID) | INTRAMUSCULAR | Status: AC
  Administered 2020-06-24 (×3): 30 mg via INTRAVENOUS
  Filled 2020-06-24 (×3): qty 1

## 2020-06-24 MED ORDER — WITCH HAZEL-GLYCERIN EX PADS: 1.0000 "application " | MEDICATED_PAD | CUTANEOUS | Status: DC | PRN

## 2020-06-24 MED ORDER — SIMETHICONE 80 MG PO CHEW
80.0000 mg | CHEWABLE_TABLET | ORAL | Status: DC
  Administered 2020-06-24 – 2020-06-26 (×2): 80 mg via ORAL
  Filled 2020-06-24 (×2): qty 1

## 2020-06-24 MED ORDER — ACETAMINOPHEN 500 MG PO TABS
1000.0000 mg | ORAL_TABLET | Freq: Four times a day (QID) | ORAL | Status: DC
  Administered 2020-06-24 – 2020-06-26 (×7): 1000 mg via ORAL
  Filled 2020-06-24 (×7): qty 2

## 2020-06-24 MED ORDER — PRENATAL MULTIVITAMIN CH
1.0000 | ORAL_TABLET | Freq: Every day | ORAL | Status: DC
  Administered 2020-06-25: 1 via ORAL
  Filled 2020-06-24: qty 1

## 2020-06-24 MED ORDER — HYDROMORPHONE HCL 1 MG/ML IJ SOLN
INTRAMUSCULAR | Status: AC
  Filled 2020-06-24: qty 0.5

## 2020-06-24 MED ORDER — OXYCODONE HCL 5 MG/5ML PO SOLN: 5.0000 mg | Freq: Once | ORAL | Status: DC | PRN

## 2020-06-24 MED ORDER — MENTHOL 3 MG MT LOZG: 1.0000 | LOZENGE | OROMUCOSAL | Status: DC | PRN

## 2020-06-24 MED ORDER — SENNOSIDES-DOCUSATE SODIUM 8.6-50 MG PO TABS
2.0000 | ORAL_TABLET | ORAL | Status: DC
  Administered 2020-06-24: 2 via ORAL
  Filled 2020-06-24 (×2): qty 2

## 2020-06-24 MED ORDER — OXYCODONE HCL 5 MG PO TABS: 5.0000 mg | ORAL_TABLET | Freq: Once | ORAL | Status: DC | PRN

## 2020-06-24 MED ORDER — GABAPENTIN 100 MG PO CAPS
100.0000 mg | ORAL_CAPSULE | Freq: Once | ORAL | Status: AC
  Administered 2020-06-24: 100 mg via ORAL
  Filled 2020-06-24: qty 1

## 2020-06-24 MED ORDER — IBUPROFEN 800 MG PO TABS
800.0000 mg | ORAL_TABLET | Freq: Four times a day (QID) | ORAL | Status: DC
  Administered 2020-06-25: 800 mg via ORAL
  Filled 2020-06-24: qty 1

## 2020-06-24 MED ORDER — SIMETHICONE 80 MG PO CHEW: 80.0000 mg | CHEWABLE_TABLET | ORAL | Status: DC | PRN

## 2020-06-24 MED ORDER — FENTANYL CITRATE (PF) 100 MCG/2ML IJ SOLN
INTRAMUSCULAR | Status: DC | PRN
Start: 2020-06-24 — End: 2020-06-24
  Administered 2020-06-24: 100 ug via INTRAVENOUS

## 2020-06-24 MED ORDER — HYDROMORPHONE HCL 1 MG/ML IJ SOLN
0.2500 mg | INTRAMUSCULAR | Status: DC | PRN
  Administered 2020-06-24: 0.5 mg via INTRAVENOUS

## 2020-06-24 MED ORDER — DIPHENHYDRAMINE HCL 25 MG PO CAPS: 25.0000 mg | ORAL_CAPSULE | ORAL | Status: DC | PRN

## 2020-06-24 MED ORDER — DIBUCAINE (PERIANAL) 1 % EX OINT: 1.0000 "application " | TOPICAL_OINTMENT | CUTANEOUS | Status: DC | PRN

## 2020-06-24 MED ORDER — OXYTOCIN-SODIUM CHLORIDE 30-0.9 UT/500ML-% IV SOLN
INTRAVENOUS | Status: AC
  Filled 2020-06-24: qty 500

## 2020-06-24 MED ORDER — NALBUPHINE HCL 10 MG/ML IJ SOLN: 5.0000 mg | INTRAMUSCULAR | Status: DC | PRN

## 2020-06-24 MED ORDER — KETOROLAC TROMETHAMINE 30 MG/ML IJ SOLN
INTRAMUSCULAR | Status: AC
  Filled 2020-06-24: qty 1

## 2020-06-24 MED ORDER — ONDANSETRON HCL 4 MG/2ML IJ SOLN: 4.0000 mg | Freq: Three times a day (TID) | INTRAMUSCULAR | Status: DC | PRN

## 2020-06-24 MED ORDER — DEXAMETHASONE SODIUM PHOSPHATE 10 MG/ML IJ SOLN
INTRAMUSCULAR | Status: DC | PRN
  Administered 2020-06-24: 10 mg via INTRAVENOUS

## 2020-06-24 MED ORDER — ACETAMINOPHEN 500 MG PO TABS
1000.0000 mg | ORAL_TABLET | Freq: Four times a day (QID) | ORAL | Status: AC
  Administered 2020-06-24: 1000 mg via ORAL
  Filled 2020-06-24 (×2): qty 2

## 2020-06-24 MED ORDER — NALOXONE HCL 4 MG/10ML IJ SOLN
1.0000 ug/kg/h | INTRAVENOUS | Status: DC | PRN
  Filled 2020-06-24: qty 5

## 2020-06-24 MED ORDER — FENTANYL CITRATE (PF) 100 MCG/2ML IJ SOLN
INTRAMUSCULAR | Status: AC
  Filled 2020-06-24: qty 2

## 2020-06-24 MED ORDER — NALBUPHINE HCL 10 MG/ML IJ SOLN: 5.0000 mg | Freq: Once | INTRAMUSCULAR | Status: DC | PRN

## 2020-06-24 MED ORDER — DEXAMETHASONE SODIUM PHOSPHATE 10 MG/ML IJ SOLN
INTRAMUSCULAR | Status: AC
  Filled 2020-06-24: qty 1

## 2020-06-24 MED ORDER — CEFAZOLIN SODIUM-DEXTROSE 2-4 GM/100ML-% IV SOLN
INTRAVENOUS | Status: AC
  Filled 2020-06-24: qty 100

## 2020-06-24 MED ORDER — ZOLPIDEM TARTRATE 5 MG PO TABS: 5.0000 mg | ORAL_TABLET | Freq: Every evening | ORAL | Status: DC | PRN

## 2020-06-24 MED ORDER — LIDOCAINE-EPINEPHRINE (PF) 2 %-1:200000 IJ SOLN
INTRAMUSCULAR | Status: DC | PRN
  Administered 2020-06-24: 10 mL via EPIDURAL
  Administered 2020-06-24: 3 mL via EPIDURAL

## 2020-06-24 MED ORDER — MORPHINE SULFATE (PF) 0.5 MG/ML IJ SOLN
INTRAMUSCULAR | Status: DC | PRN
Start: 2020-06-24 — End: 2020-06-24
  Administered 2020-06-24: 3 mg via EPIDURAL

## 2020-06-24 MED ORDER — COCONUT OIL OIL: 1.0000 "application " | TOPICAL_OIL | Status: DC | PRN

## 2020-06-24 MED ORDER — FENTANYL CITRATE (PF) 100 MCG/2ML IJ SOLN
INTRAMUSCULAR | Status: DC | PRN
  Administered 2020-06-24: 100 ug via EPIDURAL

## 2020-06-24 MED ORDER — DIPHENHYDRAMINE HCL 50 MG/ML IJ SOLN: 12.5000 mg | INTRAMUSCULAR | Status: DC | PRN

## 2020-06-24 MED ORDER — DIPHENHYDRAMINE HCL 25 MG PO CAPS: 25.0000 mg | ORAL_CAPSULE | Freq: Four times a day (QID) | ORAL | Status: DC | PRN

## 2020-06-24 MED ORDER — OXYTOCIN-SODIUM CHLORIDE 30-0.9 UT/500ML-% IV SOLN
2.5000 [IU]/h | INTRAVENOUS | Status: AC
  Administered 2020-06-24: 2.5 [IU]/h via INTRAVENOUS
  Filled 2020-06-24: qty 500

## 2020-06-24 MED ORDER — SCOPOLAMINE 1 MG/3DAYS TD PT72
MEDICATED_PATCH | TRANSDERMAL | Status: AC
  Filled 2020-06-24: qty 1

## 2020-06-24 MED ORDER — OXYCODONE HCL 5 MG PO TABS: 5.0000 mg | ORAL_TABLET | ORAL | Status: DC | PRN

## 2020-06-24 MED ORDER — MEASLES, MUMPS & RUBELLA VAC IJ SOLR: 0.5000 mL | Freq: Once | INTRAMUSCULAR | Status: DC

## 2020-06-24 MED ORDER — TETANUS-DIPHTH-ACELL PERTUSSIS 5-2.5-18.5 LF-MCG/0.5 IM SUSP: 0.5000 mL | Freq: Once | INTRAMUSCULAR | Status: DC

## 2020-06-24 MED ORDER — LACTATED RINGERS IV SOLN: INTRAVENOUS | Status: DC

## 2020-06-24 MED ORDER — SODIUM CHLORIDE 0.9% FLUSH: 3.0000 mL | INTRAVENOUS | Status: DC | PRN

## 2020-06-24 MED ORDER — SODIUM CHLORIDE 0.9 % IV SOLN
INTRAVENOUS | Status: AC
  Filled 2020-06-24: qty 500

## 2020-06-24 MED ORDER — SODIUM CHLORIDE 0.9 % IV SOLN
500.0000 mg | INTRAVENOUS | Status: AC
  Administered 2020-06-24: 500 mg via INTRAVENOUS

## 2020-06-24 MED ORDER — PROMETHAZINE HCL 25 MG/ML IJ SOLN: 6.2500 mg | INTRAMUSCULAR | Status: DC | PRN

## 2020-06-24 SURGICAL SUPPLY — 38 items
ADH SKN CLS APL DERMABOND .7 (GAUZE/BANDAGES/DRESSINGS) ×1
CHLORAPREP W/TINT 26ML (MISCELLANEOUS) ×2 IMPLANT
CLAMP CORD UMBIL (MISCELLANEOUS) IMPLANT
CLOTH BEACON ORANGE TIMEOUT ST (SAFETY) ×2 IMPLANT
DERMABOND ADVANCED (GAUZE/BANDAGES/DRESSINGS) ×1
DERMABOND ADVANCED .7 DNX12 (GAUZE/BANDAGES/DRESSINGS) ×1 IMPLANT
DRSG OPSITE POSTOP 4X10 (GAUZE/BANDAGES/DRESSINGS) ×2 IMPLANT
ELECT REM PT RETURN 9FT ADLT (ELECTROSURGICAL) ×2
ELECTRODE REM PT RTRN 9FT ADLT (ELECTROSURGICAL) ×1 IMPLANT
EXTRACTOR VACUUM BELL STYLE (SUCTIONS) IMPLANT
GLOVE BIOGEL PI IND STRL 7.0 (GLOVE) ×1 IMPLANT
GLOVE BIOGEL PI IND STRL 8 (GLOVE) ×1 IMPLANT
GLOVE BIOGEL PI INDICATOR 7.0 (GLOVE) ×1
GLOVE BIOGEL PI INDICATOR 8 (GLOVE) ×1
GLOVE ECLIPSE 8.0 STRL XLNG CF (GLOVE) ×2 IMPLANT
GOWN STRL REUS W/TWL LRG LVL3 (GOWN DISPOSABLE) ×4 IMPLANT
KIT ABG SYR 3ML LUER SLIP (SYRINGE) ×2 IMPLANT
NEEDLE HYPO 18GX1.5 BLUNT FILL (NEEDLE) ×2 IMPLANT
NEEDLE HYPO 22GX1.5 SAFETY (NEEDLE) ×2 IMPLANT
NEEDLE HYPO 25X5/8 SAFETYGLIDE (NEEDLE) ×2 IMPLANT
NS IRRIG 1000ML POUR BTL (IV SOLUTION) ×2 IMPLANT
PACK C SECTION WH (CUSTOM PROCEDURE TRAY) ×2 IMPLANT
PAD OB MATERNITY 4.3X12.25 (PERSONAL CARE ITEMS) ×2 IMPLANT
PENCIL SMOKE EVAC W/HOLSTER (ELECTROSURGICAL) ×2 IMPLANT
RTRCTR C-SECT PINK 25CM LRG (MISCELLANEOUS) IMPLANT
SUT CHROMIC 0 CT 1 (SUTURE) ×2 IMPLANT
SUT MNCRL 0 VIOLET CTX 36 (SUTURE) ×2 IMPLANT
SUT MONOCRYL 0 CTX 36 (SUTURE) ×4
SUT PLAIN 2 0 (SUTURE)
SUT PLAIN 2 0 XLH (SUTURE) IMPLANT
SUT PLAIN ABS 2-0 CT1 27XMFL (SUTURE) IMPLANT
SUT VIC AB 0 CTX 36 (SUTURE) ×2
SUT VIC AB 0 CTX36XBRD ANBCTRL (SUTURE) ×1 IMPLANT
SUT VIC AB 4-0 KS 27 (SUTURE) IMPLANT
SYR 20CC LL (SYRINGE) ×4 IMPLANT
TOWEL OR 17X24 6PK STRL BLUE (TOWEL DISPOSABLE) ×2 IMPLANT
TRAY FOLEY W/BAG SLVR 14FR LF (SET/KITS/TRAYS/PACK) IMPLANT
WATER STERILE IRR 1000ML POUR (IV SOLUTION) ×2 IMPLANT

## 2020-06-24 NOTE — Transfer of Care (Signed)
Immediate Anesthesia Transfer of Care Note  Patient: [name]  Procedure(s) Performed: CESAREAN SECTION (N/A )  Patient Location: PACU  Anesthesia Type:Epidural  Level of Consciousness: awake, alert , oriented and patient cooperative  Airway & Oxygen Therapy: Patient Spontanous Breathing  Post-op Assessment: Report given to RN and Post -op Vital signs reviewed and stable  Post vital signs: Reviewed and stable  Last Vitals:  Vitals Value Taken Time  BP 107/68 06/24/20 0318  Temp    Pulse 131 06/24/20 0319  Resp 18 06/24/20 0319  SpO2 98 % 06/24/20 0319  Vitals shown include unvalidated device data.  Last Pain:  Vitals:   06/24/20 0110  TempSrc:   PainSc: 0-No pain      Patients Stated Pain Goal: 4 (06/23/20 0744)  Complications: No complications documented.

## 2020-06-24 NOTE — Discharge Summary (Signed)
   Postpartum Discharge Summary     Patient Name: [name] DOB: 12/19/1990 MRN: [id number]  Date of admission: 06/23/2020 Delivery date:06/24/2020  Delivering provider: EURE, LUTHER H  Date of discharge: 06/26/2020  Admitting diagnosis: Indication for care in labor and delivery, antepartum [O75.9] Intrauterine pregnancy: [redacted]w[redacted]d     Secondary diagnosis:  Active Problems:   HSV-2 seropositive   Indication for care in labor and delivery, antepartum   Preeclampsia, third trimester   Arrest of descent, delivered, current hospitalization   Cesarean delivery delivered   Acute kidney injury (HCC)   Discharge diagnosis: Term Pregnancy Delivered                                              Post partum procedures:none Augmentation: AROM, Pitocin, Cytotec and IP Foley Complications: AKI   Hospital course: Induction of Labor With Cesarean Section   29 y.o. yo G1P0 at [redacted]w[redacted]d was admitted to the hospital 06/23/2020 for induction of labor. Patient had a labor course significant for progressing to complete and pushing for over two hours with no descent of infant. The patient went for cesarean section due to Arrest of Descent. Delivery details are as follows: Membrane Rupture Time/Date: 9:50 AM ,06/23/2020   Delivery Method:C-Section, Low Transverse  Details of operation can be found in separate operative Note.  Patient had an uncomplicated postpartum course. She is ambulating, tolerating a regular diet, passing flatus, and urinating well.  Patient is discharged home in stable condition on 06/26/20.      Newborn Data: Birth date:06/24/2020  Birth time:2:19 AM  Gender:Female  Living status:Living  Apgars:8 ,9  Weight:3600 g                                 Magnesium Sulfate received: No BMZ received: No Rhophylac:No MMR:No T-DaP:Given prenatally Flu: No Transfusion:No  Physical exam  Vitals:   06/25/20 0600 06/25/20 1558 06/25/20 2208 06/26/20 0531  BP: 96/70 101/64 103/71 98/64   Pulse: 78 63 73 (!) 58  Resp: 18 18  18  Temp: 97.8 F (36.6 C) 98 F (36.7 C) 98.2 F (36.8 C) 98.4 F (36.9 C)  TempSrc: Oral Oral Oral Oral  SpO2: 99% 99% 99% 99%  Weight:      Height:       General: alert, cooperative and no distress Lochia: appropriate Uterine Fundus: firm Incision: Healing well with no significant drainage DVT Evaluation: No evidence of DVT seen on physical exam. Negative Homan's sign. Labs: Lab Results  Component Value Date   WBC 18.2 (H) 06/25/2020   HGB 10.3 (L) 06/25/2020   HCT 30.7 (L) 06/25/2020   MCV 90.8 06/25/2020   PLT 221 06/25/2020   CMP Latest Ref Rng & Units 06/25/2020  Glucose 70 - 99 mg/dL 87  BUN 6 - 20 mg/dL 13  Creatinine 0.44 - 1.00 mg/dL 1.26(H)  Sodium 135 - 145 mmol/L 140  Potassium 3.5 - 5.1 mmol/L 3.9  Chloride 98 - 111 mmol/L 107  CO2 22 - 32 mmol/L 23  Calcium 8.9 - 10.3 mg/dL 8.5(L)  Total Protein 6.5 - 8.1 g/dL 5.1(L)  Total Bilirubin 0.3 - 1.2 mg/dL 0.5  Alkaline Phos 38 - 126 U/L 128(H)  AST 15 - 41 U/L 41  ALT 0 - 44 U/L 14   Edinburgh   Score: No flowsheet data found.   After visit meds:  Allergies as of 06/26/2020   No Known Allergies     Medication List    STOP taking these medications   metroNIDAZOLE 500 MG tablet Commonly known as: FLAGYL     TAKE these medications   acetaminophen 500 MG tablet Commonly known as: TYLENOL Take 2 tablets (1,000 mg total) by mouth every 6 (six) hours.   Blood Pressure Monitor Automat Devi 1 Device by Does not apply route daily. Automatic blood pressure cuff regular size. To monitor blood pressure regularly at home. ICD-10 code: O09.90   ferrous sulfate 325 (65 FE) MG tablet Take 1 tablet (325 mg total) by mouth every other day.   Gojji Weight Scale Misc 1 Device by Does not apply route daily as needed. To weight self daily as needed at home. ICD-10 code: O09.90   multivitamin-prenatal 27-0.8 MG Tabs tablet Take 1 tablet by mouth daily at 12 noon.    ondansetron 4 MG disintegrating tablet Commonly known as: Zofran ODT Take 1 tablet (4 mg total) by mouth every 8 (eight) hours as needed for nausea or vomiting.   oxyCODONE 5 MG immediate release tablet Commonly known as: Oxy IR/ROXICODONE Take 1 tablet (5 mg total) by mouth every 4 (four) hours as needed for moderate pain.   valACYclovir 500 MG tablet Commonly known as: Valtrex Take 1 tablet (500 mg total) by mouth 2 (two) times daily.            Discharge Care Instructions  (From admission, onward)         Start     Ordered   06/26/20 0000  Leave dressing on - Keep it clean, dry, and intact until clinic visit        06/26/20 0745           Discharge home in stable condition Infant Feeding: Breast Infant Disposition:home with mother Discharge instruction: per After Visit Summary and Postpartum booklet. Activity: Advance as tolerated. Pelvic rest for 6 weeks.  Diet: routine diet Future Appointments: Future Appointments  Date Time Provider [place]  07/04/2020  9:45 AM [facility] NURSE [facility] None  08/06/2020  1:50 PM Laury Deep, CNM [facility] None  09/03/2020  9:00 AM Lorretta Harp, MD [facility]   Follow up Visit:  Please schedule this patient for a In person postpartum visit in 6 weeks with the following provider: Any provider. Additional Postpartum F/U:Incision check 1 week, BMP in one week  High risk pregnancy complicated by: AKI Delivery mode:  C-Section, Low Transverse  Anticipated Birth Control:  IUD at postpartum appointment   06/26/2020 Janet Berlin, MD

## 2020-06-24 NOTE — Progress Notes (Signed)
[name] was referred for history of depression/anxiety. * Referral screened out by Clinical Social Worker because none of the following criteria appear to apply: ~ History of anxiety/depression during this pregnancy, or of post-partum depression following prior delivery. ~ Diagnosis of anxiety and/or depression within last 3 years. Per further chart review, [name] diagnosed with anxiety in 2015, depression in 2015, and OCD in 2016.  OR * [name]'s symptoms currently being treated with medication and/or therapy.   Please contact the Clinical Social Worker if needs arise, by [name] request, or if [name] scores greater than 9/yes to question 10 on Edinburgh Postpartum Depression Screen.    [name], MSW, LCSW [facility] [phone]   

## 2020-06-24 NOTE — Progress Notes (Signed)
LABOR PROGRESS NOTE  [name] is a 29 y.o. G1P0 at [redacted]w[redacted]d  admitted for IOL for PD. PEC diagnosed intrapartum- decreased urine output, Cr 1.15, PCR 2.97  Subjective: Patient tearful, reports significant fatigue   Objective: BP 110/75   Pulse 91   Temp 98.9 F (37.2 C) (Oral)   Resp 13   Ht 5\' 1"  (1.549 m)   Wt 58.6 kg   LMP 08/13/2019   SpO2 99%   BMI 24.41 kg/m  or  Vitals:   06/23/20 2230 06/23/20 2306 06/23/20 2331 06/24/20 0001  BP: (!) 97/58 (!) 108/57 117/80 110/75  Pulse: 84 87 68 91  Resp: 14 15 14 13   Temp:    98.9 F (37.2 C)  TempSrc:    Oral  SpO2:      Weight:      Height:        Dilation: 10 Dilation Complete Date: 06/23/20 Dilation Complete Time: 1944 Effacement (%): 80 Cervical Position: Posterior Station: 0 Presentation: Vertex Exam by:: Dr. 06/25/20  Perineum, labia extremely edematous, hematuria noted w 100cc UOP since lasix administration  FHT: baseline rate 150, moderate varibility, +accel, neg decel  Labs: Lab Results  Component Value Date   WBC 7.2 06/23/2020   HGB 13.4 06/23/2020   HCT 40.2 06/23/2020   MCV 89.7 06/23/2020   PLT 274 06/23/2020    Patient Active Problem List   Diagnosis Date Noted  . Indication for care in labor and delivery, antepartum 06/23/2020  . Preeclampsia, third trimester 06/23/2020  . Vasovagal syncope 05/07/2020  . Supervision of normal first pregnancy, antepartum 11/19/2019  . 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 G1P0 at [redacted]w[redacted]d here for IOL for PD- PEC dx intrapartum   Labor:  S/p pushing for 2 hours, labored down for 1.5 hours and still at 0 station with significant caput. Significant maternal exhaustion noted. Will plan to push for 30 minutes and reassess, have discussed suspicion of cephalopelvic disproportion with patient and family at bedside. Pit at 24.   AKI, possible PEC: Cr 1.15, minimal UOP despite 40mg  IV lasix, has remained normotensive. Hematuria present.  Continue to monitor, will recheck CMP after delivery.   Fetal Wellbeing:  Cat I  Pain Control:  Epidural   26, MD 06/24/2020, 12:23 AM

## 2020-06-24 NOTE — Progress Notes (Signed)
Brief Update:  Despite laboring down, baby still at 0 station, asynclitic with large amount of caput. Mother reporting exhaustion and declines trial of pushing. After discussion with patient and family will proceed with primary LTCS for arrest of descent, likely CPD. The risks of cesarean section were discussed with the patient including but were not limited to: bleeding which may require transfusion or reoperation; infection which may require antibiotics; injury to bowel, bladder, ureters or other surrounding organs; injury to the fetus; need for additional procedures including hysterectomy in the event of a life-threatening hemorrhage; formation of adhesions; placental abnormalities wth subsequent pregnancies; incisional problems; thromboembolic phenomenon and other postoperative/anesthesia complications. The patient concurred with the proposed plan, giving informed written consent for the procedures. She will remain NPO for procedure. Anesthesia and OR aware.  Preoperative prophylactic antibiotics and SCDs ordered on call to the OR.  To OR when ready.   Casper Harrison, MD [facility] Family Medicine Fellow, [facility] for [facility], [facility]

## 2020-06-24 NOTE — Anesthesia Postprocedure Evaluation (Signed)
Anesthesia Post Note  Patient: Chief Financial Officer  Procedure(s) Performed: CESAREAN SECTION (N/A )     Patient location during evaluation: PACU Anesthesia Type: Epidural Level of consciousness: oriented and awake and alert Pain management: pain level controlled Vital Signs Assessment: post-procedure vital signs reviewed and stable Respiratory status: spontaneous breathing and respiratory function stable Cardiovascular status: blood pressure returned to baseline and stable Postop Assessment: no headache, no backache, no apparent nausea or vomiting, epidural receding and patient able to bend at knees Anesthetic complications: no   No complications documented.  Last Vitals:  Vitals:   06/24/20 0457 06/24/20 0611  BP: 115/87 (P) 114/78  Pulse: 96 (!) (P) 107  Resp: 16   Temp: (!) 38.8 C (P) 37.9 C  SpO2:  (P) 96%    Last Pain:  Vitals:   06/24/20 0611  TempSrc: (P) Oral  PainSc:    Pain Goal: Patients Stated Pain Goal: 4 (06/23/20 0744)                 Lannie Fields

## 2020-06-24 NOTE — Op Note (Signed)
Cesarean Section Procedure Note   Weslyn Holsonback  06/23/2020 - 06/24/2020  Indications: arrest of descent   Pre-operative Diagnosis: primary cesarean section; seconday arrest of dilation in second stage of labor.   Post-operative Diagnosis: Same   Surgeon: Surgeon(s) and Role:    * Eure, Amaryllis Dyke, MD - Primary    Assistants: Casper Harrison, MD   Anesthesia: epidural    Estimated Blood Loss: 837cc  Total IV Fluids:   Urine Output: 200 cc's of clear urine after delivery of baby   Specimens: @ORSPECIMEN @   Findings:  Baby condition / location:  Nursery pending APGAR: 8, 9   Complications: no complications  Indications: Alberta Lenhard is a 29 y.o. G1P0 with an IUP [redacted]w[redacted]d presented for IOL for late term. She received a foley bulb, cytotec, and IV Pitocin and progressed to complete. She pushed for over two hours without descent of baby, and had increasing caput and maternal exhaustion. Course further complicated by decreased urine output and an AKI with creatinine 1.15, urine P:C 2.97.   The risks, benefits, complications, treatment options, and expected outcomes were discussed with the patient . The patient concurred with the proposed plan, giving informed consent. identified as [redacted]w[redacted]d and the procedure verified as C-Section Delivery.  Procedure Details: A Time Out was held and the above information confirmed.  The patient was taken back to the operative suite where anesthesia was determined to be adequate.  After induction of anesthesia, the patient was draped and prepped in the usual sterile manner and placed in a dorsal supine position with a leftward tilt. A pfannenstiel was made and carried down through the subcutaneous tissue to the fascia. Fascial incision was made and extended transversely. The fascia was separated from the underlying rectus tissue superiorly and inferiorly. The peritoneum was identified and entered. Peritoneal incision was extended  longitudinally. A low transverse uterine incision was made. Delivered from cephalic presentation was a viable female infant with Apgar scores of 8 at one minute and 9 at five minutes. Cord ph was sent the umbilical cord was clamped and cut cord blood was obtained for evaluation. The placenta was removed Intact and appeared normal. The uterine outline, tubes and ovaries appeared normal}. The uterine incision was closed with running locked sutures of 0 monocryl . A second imbricating layer was done.  Hemostasis was observed. Lavage was carried out until clear. The fascia was then reapproximated with running sutures of 0 vicryl. The subcuticular closure was performed using 0plain gut. The skin was closed with 4-0Vicryl.   Instrument, sponge, and needle counts were correct prior the abdominal closure and were correct at the conclusion of the case.     Disposition: PACU - hemodynamically stable.   Maternal Condition: stable    Signed: [name] [facility] 06/24/2020 3:16 AM

## 2020-06-25 ENCOUNTER — Encounter (HOSPITAL_COMMUNITY): Payer: Self-pay | Admitting: Obstetrics & Gynecology

## 2020-06-25 LAB — URINALYSIS, ROUTINE W REFLEX MICROSCOPIC
Bacteria, UA: NONE SEEN
Bilirubin Urine: NEGATIVE
Glucose, UA: NEGATIVE mg/dL
Ketones, ur: NEGATIVE mg/dL
Nitrite: NEGATIVE
Protein, ur: NEGATIVE mg/dL
RBC / HPF: >50 RBC/hpf — ABNORMAL HIGH (ref 0–5)
Specific Gravity, Urine: 1.014 (ref 1.005–1.030)
pH: 5 (ref 5.0–8.0)

## 2020-06-25 LAB — CBC
HCT: 30.7 % — ABNORMAL LOW (ref 36.0–46.0)
Hemoglobin: 10.3 g/dL — ABNORMAL LOW (ref 12.0–15.0)
MCH: 30.5 pg (ref 26.0–34.0)
MCHC: 33.6 g/dL (ref 30.0–36.0)
MCV: 90.8 fL (ref 80.0–100.0)
Platelets: 221 10*3/uL (ref 150–400)
RBC: 3.38 MIL/uL — ABNORMAL LOW (ref 3.87–5.11)
RDW: 16.2 % — ABNORMAL HIGH (ref 11.5–15.5)
WBC: 18.2 10*3/uL — ABNORMAL HIGH (ref 4.0–10.5)
nRBC: 0 % (ref 0.0–0.2)

## 2020-06-25 LAB — COMPREHENSIVE METABOLIC PANEL
ALT: 12 U/L (ref 0–44)
ALT: 14 U/L (ref 0–44)
AST: 43 U/L — ABNORMAL HIGH (ref 15–41)
AST: 41 U/L (ref 15–41)
Albumin: 1.7 g/dL — ABNORMAL LOW (ref 3.5–5.0)
Albumin: 1.8 g/dL — ABNORMAL LOW (ref 3.5–5.0)
Alkaline Phosphatase: 127 U/L — ABNORMAL HIGH (ref 38–126)
Alkaline Phosphatase: 128 U/L — ABNORMAL HIGH (ref 38–126)
Anion gap: 7 (ref 5–15)
Anion gap: 10 (ref 5–15)
BUN: 13 mg/dL (ref 6–20)
BUN: 13 mg/dL (ref 6–20)
CO2: 24 mmol/L (ref 22–32)
CO2: 23 mmol/L (ref 22–32)
Calcium: 8.5 mg/dL — ABNORMAL LOW (ref 8.9–10.3)
Calcium: 8.5 mg/dL — ABNORMAL LOW (ref 8.9–10.3)
Chloride: 108 mmol/L (ref 98–111)
Chloride: 107 mmol/L (ref 98–111)
Creatinine, Ser: 1.32 mg/dL — ABNORMAL HIGH (ref 0.44–1.00)
Creatinine, Ser: 1.26 mg/dL — ABNORMAL HIGH (ref 0.44–1.00)
GFR calc Af Amer: >60 mL/min (ref >60)
GFR calc Af Amer: >60 mL/min (ref >60)
GFR calc non Af Amer: 55 mL/min — ABNORMAL LOW (ref >60)
GFR calc non Af Amer: 58 mL/min — ABNORMAL LOW (ref >60)
Glucose, Bld: 90 mg/dL (ref 70–99)
Glucose, Bld: 87 mg/dL (ref 70–99)
Potassium: 4 mmol/L (ref 3.5–5.1)
Potassium: 3.9 mmol/L (ref 3.5–5.1)
Sodium: 139 mmol/L (ref 135–145)
Sodium: 140 mmol/L (ref 135–145)
Total Bilirubin: 0.4 mg/dL (ref 0.3–1.2)
Total Bilirubin: 0.5 mg/dL (ref 0.3–1.2)
Total Protein: 4.4 g/dL — ABNORMAL LOW (ref 6.5–8.1)
Total Protein: 5.1 g/dL — ABNORMAL LOW (ref 6.5–8.1)

## 2020-06-25 NOTE — Lactation Note (Signed)
This note was copied from a baby's chart. Lactation Consultation Note  Patient Name: [name] [id number] Date: 06/25/2020 Reason for consult: Follow-up assessment   P1, Baby sleepy after circ.  Reviewed hand expression. Attempted STS feeding baby but he did not wake to feed. Encouraged trying again in a hour - 1.5 hours. Feed on demand with cues.  Goal 8-12+ times per day after first 24 hrs.  Place baby STS if not cueing.  Reviewed basics. Mom made aware of O/P services, breastfeeding support groups, community resources, and our phone # for post-discharge questions.    Maternal Data Has patient been taught Hand Expression?: Yes Does the patient have breastfeeding experience prior to this delivery?: No  Feeding    LATCH Score                   Interventions Interventions: Breast feeding basics reviewed;Hand express  Lactation Tools Discussed/Used     Consult Status Consult Status: Follow-up Date: 06/26/20 Follow-up type: In-patient    [facility] 06/25/2020, 2:57 PM

## 2020-06-25 NOTE — Progress Notes (Addendum)
Patient ID: Frankie Zito, female   DOB: 01/12/1991, 29 y.o.   MRN: [id number] Post Partum Day 1   Subjective: Youa Deloney is a 29 y.o. G1P1001 on POD1 s/p primary cesarean section due to secondary arrest of dilation in second stage of labor. She is doing well this morning and is tolerating oral intake, urinating well, with minimal bleeding, and pain. She has not been passing gas or had a bowel movement, but says she feels like she will need to have BM today. She has not been up and moving around much due to dizziness, but plans to try today with the nurse. She denies shortness of breath, fever, chills, nausea, vomiting, headache, visual changes, leg pain. Endorses swelling in legs and heavy feeling in her legs making it difficult to walk.   Objective: Blood pressure 96/70, pulse 78, temperature 97.8 F (36.6 C), temperature source Oral, resp. rate 18, height 5\' 1"  (1.549 m), weight 58.6 kg, last menstrual period 08/13/2019, SpO2 99 %, unknown if currently breastfeeding.  Physical Exam:  General: alert and cooperative Lochia: appropriate Uterine Fundus: firm Incision: healing well, no significant drainage, no significant erythema DVT Evaluation: No evidence of DVT seen on physical exam. No pitting edema noted in lower extremities.   Recent Labs    06/23/20 0024 06/25/20 0553  HGB 13.4 10.3*  HCT 40.2 30.7*   CMP     Component Value Date/Time   NA 139 06/25/2020 0553   K 4.0 06/25/2020 0553   CL 108 06/25/2020 0553   CO2 24 06/25/2020 0553   GLUCOSE 90 06/25/2020 0553   BUN 13 06/25/2020 0553   CREATININE 1.32 (H) 06/25/2020 0553   CALCIUM 8.5 (L) 06/25/2020 0553   PROT 4.4 (L) 06/25/2020 0553   ALBUMIN 1.7 (L) 06/25/2020 0553   AST 43 (H) 06/25/2020 0553   ALT 12 06/25/2020 0553   ALKPHOS 127 (H) 06/25/2020 0553   BILITOT 0.4 06/25/2020 0553   GFRNONAA 55 (L) 06/25/2020 0553   GFRAA >60 06/25/2020 0553   CBC    Component Value Date/Time   WBC 18.2 (H) 06/25/2020  0553   RBC 3.38 (L) 06/25/2020 0553   HGB 10.3 (L) 06/25/2020 0553   HGB 9.9 (L) 03/21/2020 0821   HCT 30.7 (L) 06/25/2020 0553   HCT 30.3 (L) 03/21/2020 0821   PLT 221 06/25/2020 0553   PLT 291 03/21/2020 0821   MCV 90.8 06/25/2020 0553   MCV 93 03/21/2020 0821   MCH 30.5 06/25/2020 0553   MCHC 33.6 06/25/2020 0553   RDW 16.2 (H) 06/25/2020 0553   RDW 12.8 03/21/2020 0821   LYMPHSABS 1.7 11/29/2019 1003   MONOABS 0.5 03/03/2012 2130   EOSABS 0.1 11/29/2019 1003   BASOSABS 0.0 11/29/2019 1003   Assessment/Plan: Circumcision prior to discharge    #S/P c-section AoD  -continue routine care   #IAI: Tmax 101.8 on POD#0. Received Unasyn x1. Has remained afebrile since, but w leukocytosis (WBC 7.2 > 18.2). Continue Unasyn for 48 hrs post delivery.    #obstructive AKI  Creatinine increased from 1.15 >>1.32. Urine output has remained good. Blood pressures have been normotensive throughout admission. No headaches or visual changes. No concern for preeclampsia at this time.  - afternoon CMP  - d/c ibuprofen     LOS: 2 days   12/01/2019 06/25/2020, 7:54 AM   I saw and evaluated the patient. I agree with the findings and the plan of care as documented in the medical student's note and have  made any necessary edits. Afternoon creatinine improved from earlier, continues to have excellent UOP s/p foley removal.   Casper Harrison, MD [facility] Family Medicine Fellow, [facility] for [facility], [facility]

## 2020-06-25 NOTE — Lactation Note (Signed)
This note was copied from a baby's chart. Lactation Consultation Note  Patient Name: [name] [id number] Date: 06/25/2020   Mother resting and request LC come back later.     Maternal Data    Feeding    LATCH Score                   Interventions    Lactation Tools Discussed/Used     Consult Status      Hardie Pulley 06/25/2020, 12:17 PM

## 2020-06-26 DIAGNOSIS — N179 Acute kidney failure, unspecified: Secondary | ICD-10-CM

## 2020-06-26 MED ORDER — ACETAMINOPHEN 500 MG PO TABS
1000.0000 mg | ORAL_TABLET | Freq: Four times a day (QID) | ORAL | 0 refills | Status: DC
Start: 2020-06-26 — End: 2020-06-26

## 2020-06-26 MED ORDER — ACETAMINOPHEN 500 MG PO TABS: 1000.0000 mg | ORAL_TABLET | Freq: Four times a day (QID) | ORAL | 0 refills | Status: DC

## 2020-06-26 MED ORDER — OXYCODONE HCL 5 MG PO TABS
5.0000 mg | ORAL_TABLET | ORAL | 0 refills | Status: DC | PRN
Start: 2020-06-26 — End: 2020-06-26

## 2020-06-26 MED ORDER — OXYCODONE HCL 5 MG PO TABS
5.0000 mg | ORAL_TABLET | ORAL | 0 refills | Status: DC | PRN
Start: 2020-06-26 — End: 2020-06-29

## 2020-06-26 NOTE — Discharge Instructions (Signed)
Please ask to get labs done at your Incision Check appointment.

## 2020-06-28 ENCOUNTER — Inpatient Hospital Stay (HOSPITAL_COMMUNITY): Payer: BC Managed Care – PPO

## 2020-06-28 ENCOUNTER — Encounter (HOSPITAL_COMMUNITY): Payer: Self-pay | Admitting: Obstetrics & Gynecology

## 2020-06-28 ENCOUNTER — Inpatient Hospital Stay (HOSPITAL_COMMUNITY)
Admission: AD | Admit: 2020-06-28 | Discharge: 2020-06-29 | Disposition: A | Discharge status: Home / Self Care | Payer: BC Managed Care – PPO | Attending: Obstetrics & Gynecology | Admitting: Obstetrics & Gynecology

## 2020-06-28 ENCOUNTER — Other Ambulatory Visit: Payer: Self-pay

## 2020-06-28 DIAGNOSIS — R079 Chest pain, unspecified: Secondary | ICD-10-CM | POA: Diagnosis not present

## 2020-06-28 DIAGNOSIS — R600 Localized edema: Secondary | ICD-10-CM | POA: Insufficient documentation

## 2020-06-28 DIAGNOSIS — R109 Unspecified abdominal pain: Secondary | ICD-10-CM | POA: Diagnosis not present

## 2020-06-28 DIAGNOSIS — O99893 Other specified diseases and conditions complicating puerperium: Secondary | ICD-10-CM | POA: Diagnosis not present

## 2020-06-28 DIAGNOSIS — R2240 Localized swelling, mass and lump, unspecified lower limb: Secondary | ICD-10-CM

## 2020-06-28 LAB — CBC WITH DIFFERENTIAL/PLATELET
Abs Immature Granulocytes: 0.36 10*3/uL — ABNORMAL HIGH (ref 0.00–0.07)
Basophils Absolute: 0.1 10*3/uL (ref 0.0–0.1)
Basophils Relative: 1 %
Eosinophils Absolute: 0.1 10*3/uL (ref 0.0–0.5)
Eosinophils Relative: 1 %
HCT: 30.6 % — ABNORMAL LOW (ref 36.0–46.0)
Hemoglobin: 10 g/dL — ABNORMAL LOW (ref 12.0–15.0)
Immature Granulocytes: 4 %
Lymphocytes Relative: 25 %
Lymphs Abs: 2.2 10*3/uL (ref 0.7–4.0)
MCH: 29.9 pg (ref 26.0–34.0)
MCHC: 32.7 g/dL (ref 30.0–36.0)
MCV: 91.3 fL (ref 80.0–100.0)
Monocytes Absolute: 0.6 10*3/uL (ref 0.1–1.0)
Monocytes Relative: 7 %
Neutro Abs: 5.6 10*3/uL (ref 1.7–7.7)
Neutrophils Relative %: 62 %
Platelets: 361 10*3/uL (ref 150–400)
RBC: 3.35 MIL/uL — ABNORMAL LOW (ref 3.87–5.11)
RDW: 15.9 % — ABNORMAL HIGH (ref 11.5–15.5)
WBC: 9 10*3/uL (ref 4.0–10.5)
nRBC: 0.2 % (ref 0.0–0.2)

## 2020-06-28 LAB — COMPREHENSIVE METABOLIC PANEL
ALT: 27 U/L (ref 0–44)
AST: 41 U/L (ref 15–41)
Albumin: 2.1 g/dL — ABNORMAL LOW (ref 3.5–5.0)
Alkaline Phosphatase: 138 U/L — ABNORMAL HIGH (ref 38–126)
Anion gap: 8 (ref 5–15)
BUN: 8 mg/dL (ref 6–20)
CO2: 25 mmol/L (ref 22–32)
Calcium: 8.4 mg/dL — ABNORMAL LOW (ref 8.9–10.3)
Chloride: 107 mmol/L (ref 98–111)
Creatinine, Ser: 0.74 mg/dL (ref 0.44–1.00)
GFR calc Af Amer: >60 mL/min (ref >60)
GFR calc non Af Amer: >60 mL/min (ref >60)
Glucose, Bld: 100 mg/dL — ABNORMAL HIGH (ref 70–99)
Potassium: 4 mmol/L (ref 3.5–5.1)
Sodium: 140 mmol/L (ref 135–145)
Total Bilirubin: 0.3 mg/dL (ref 0.3–1.2)
Total Protein: 5.6 g/dL — ABNORMAL LOW (ref 6.5–8.1)

## 2020-06-28 LAB — URINALYSIS, ROUTINE W REFLEX MICROSCOPIC
Bacteria, UA: NONE SEEN
Bilirubin Urine: NEGATIVE
Glucose, UA: NEGATIVE mg/dL
Ketones, ur: NEGATIVE mg/dL
Nitrite: NEGATIVE
Protein, ur: NEGATIVE mg/dL
Specific Gravity, Urine: 1.009 (ref 1.005–1.030)
pH: 7 (ref 5.0–8.0)

## 2020-06-28 LAB — D-DIMER, QUANTITATIVE: D-Dimer, Quant: 4.45 ug/mL-FEU — ABNORMAL HIGH (ref 0.00–0.50)

## 2020-06-28 MED ORDER — IOHEXOL 350 MG/ML SOLN
100.0000 mL | Freq: Once | INTRAVENOUS | Status: AC | PRN
  Administered 2020-06-28: 100 mL via INTRAVENOUS

## 2020-06-28 NOTE — MAU Provider Note (Addendum)
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 G1P1001 on PPD#4 who presents to MAU c/o bilateral leg cramping 2x today with edema, chest pain on the upper right side that is worst with inspiration and coughing, and "tingling" at her incision site. She is breastfeeding. Denies fever, n/v/d, productive cough or coughing up blood. She endorses feeling emotional about her birth and leaving the baby at home this evening.    OB History     Gravida  1   Para  1   Term  1   Preterm      AB      Living  1      SAB      TAB      Ectopic      Multiple  0   Live Births  1           Past Medical History:  Diagnosis Date   Anemia    Anxiety    Costochondritis    HSV-2 infection    Vaginal Pap smear, abnormal     Past Surgical History:  Procedure Laterality Date   CESAREAN SECTION N/A 06/24/2020   Procedure: CESAREAN SECTION;  Surgeon: Lazaro Arms, MD;  Location: [facility] LD ORS;  Service: Obstetrics;  Laterality: N/A;   COLPOSCOPY     Dr. Dareen Piano at [facility] 1.5-2 years ago    Family History  Problem Relation Age of Onset   Ovarian cancer Paternal Aunt    Diabetes Maternal Grandmother     Social History   Tobacco Use   Smoking status: Never Smoker   Smokeless tobacco: Never Used  Vaping Use   Vaping Use: Never used  Substance Use Topics   Alcohol use: Not Currently   Drug use: No    Allergies: No Known Allergies  Medications  Prior to Admission  Medication Sig Dispense Refill Last Dose   acetaminophen (TYLENOL) 500 MG tablet Take 2 tablets (1,000 mg total) by mouth every 6 (six) hours. 30 tablet 0 06/28/2020 at 0330   oxyCODONE (OXY IR/ROXICODONE) 5 MG immediate release tablet Take 1 tablet (5 mg total) by mouth every 4 (four) hours as needed for moderate pain. 10 tablet 0 06/28/2020 at 0330   Blood Pressure Monitoring (BLOOD PRESSURE MONITOR AUTOMAT) DEVI 1 Device by Does not apply route daily. Automatic blood pressure cuff regular size. To monitor blood pressure regularly at home. ICD-10 code: O09.90 1 each 0    ferrous sulfate 325 (65 FE) MG tablet Take 1 tablet (325 mg total) by mouth every other day. (Patient not taking: Reported on 05/30/2020) 45 tablet 0    Misc. Devices (GOJJI WEIGHT SCALE) MISC 1 Device by Does not apply route daily as needed. To weight self daily as needed at home. ICD-10 code: O09.90 1 each 0    ondansetron (ZOFRAN ODT) 4 MG disintegrating tablet Take 1 tablet (4 mg total) by mouth every 8 (eight) hours as needed for nausea  or vomiting. (Patient not taking: Reported on 05/30/2020) 14 tablet 0    Prenatal Vit-Fe Fumarate-FA (MULTIVITAMIN-PRENATAL) 27-0.8 MG TABS tablet Take 1 tablet by mouth daily at 12 noon.      valACYclovir (VALTREX) 500 MG tablet Take 1 tablet (500 mg total) by mouth 2 (two) times daily. 60 tablet 1     Review of Systems  Constitutional: Negative for fever and malaise/fatigue.  Respiratory: Positive for cough. Negative for hemoptysis, sputum production, shortness of breath and wheezing.   Cardiovascular: Positive for chest pain (upper right side, about 2nd intercostal space).  Gastrointestinal: Negative for constipation, nausea and vomiting.  Genitourinary: Negative for flank pain.  Musculoskeletal: Negative for neck pain.  Neurological: Negative for dizziness, weakness and headaches.  All other systems reviewed and are negative.   See HPI Above Physical Exam   Blood  pressure 119/76, pulse 86, temperature 99 F (37.2 C), temperature source Oral, resp. rate 18, weight 128 lb 12.8 oz (58.4 kg), SpO2 99 %, unknown if currently breastfeeding.  Results for orders placed or performed during the hospital encounter of 06/28/20 (from the past 24 hour(s))  Urinalysis, Routine w reflex microscopic Urine, Clean Catch     Status: Abnormal   Collection Time: 06/28/20  7:47 PM  Result Value Ref Range   Color, Urine STRAW (A) YELLOW   APPearance CLEAR CLEAR   Specific Gravity, Urine 1.009 1.005 - 1.030   pH 7.0 5.0 - 8.0   Glucose, UA NEGATIVE NEGATIVE mg/dL   Hgb urine dipstick SMALL (A) NEGATIVE   Bilirubin Urine NEGATIVE NEGATIVE   Ketones, ur NEGATIVE NEGATIVE mg/dL   Protein, ur NEGATIVE NEGATIVE mg/dL   Nitrite NEGATIVE NEGATIVE   Leukocytes,Ua TRACE (A) NEGATIVE   RBC / HPF 0-5 0 - 5 RBC/hpf   WBC, UA 0-5 0 - 5 WBC/hpf   Bacteria, UA NONE SEEN NONE SEEN   Squamous Epithelial / LPF 0-5 0 - 5  Comprehensive metabolic panel     Status: Abnormal   Collection Time: 06/28/20  9:36 PM  Result Value Ref Range   Sodium 140 135 - 145 mmol/L   Potassium 4.0 3.5 - 5.1 mmol/L   Chloride 107 98 - 111 mmol/L   CO2 25 22 - 32 mmol/L   Glucose, Bld 100 (H) 70 - 99 mg/dL   BUN 8 6 - 20 mg/dL   Creatinine, Ser 1.63 0.44 - 1.00 mg/dL   Calcium 8.4 (L) 8.9 - 10.3 mg/dL   Total Protein 5.6 (L) 6.5 - 8.1 g/dL   Albumin 2.1 (L) 3.5 - 5.0 g/dL   AST 41 15 - 41 U/L   ALT 27 0 - 44 U/L   Alkaline Phosphatase 138 (H) 38 - 126 U/L   Total Bilirubin 0.3 0.3 - 1.2 mg/dL   GFR calc non Af Amer >60 >60 mL/min   GFR calc Af Amer >60 >60 mL/min   Anion gap 8 5 - 15  CBC with Differential/Platelet     Status: Abnormal   Collection Time: 06/28/20  9:36 PM  Result Value Ref Range   WBC 9.0 4.0 - 10.5 K/uL   RBC 3.35 (L) 3.87 - 5.11 MIL/uL   Hemoglobin 10.0 (L) 12.0 - 15.0 g/dL   HCT 84.5 (L) 36 - 46 %   MCV 91.3 80.0 - 100.0 fL   MCH 29.9 26.0 - 34.0 pg   MCHC 32.7 30.0 -  36.0 g/dL   RDW 36.4 (H) 68.0 - 32.1 %   Platelets 361  150 - 400 K/uL   nRBC 0.2 0.0 - 0.2 %   Neutrophils Relative % 62 %   Neutro Abs 5.6 1.7 - 7.7 K/uL   Lymphocytes Relative 25 %   Lymphs Abs 2.2 0.7 - 4.0 K/uL   Monocytes Relative 7 %   Monocytes Absolute 0.6 0 - 1 K/uL   Eosinophils Relative 1 %   Eosinophils Absolute 0.1 0 - 0 K/uL   Basophils Relative 1 %   Basophils Absolute 0.1 0 - 0 K/uL   Immature Granulocytes 4 %   Abs Immature Granulocytes 0.36 (H) 0.00 - 0.07 K/uL  D-dimer, quantitative (not at [facility])     Status: Abnormal   Collection Time: 06/28/20  9:36 PM  Result Value Ref Range   D-Dimer, Quant 4.45 (H) 0.00 - 0.50 ug/mL-FEU    Physical Exam Vitals and nursing note reviewed.  Constitutional:      General: She is not in acute distress.    Appearance: She is normal weight. She is not ill-appearing.  Cardiovascular:     Rate and Rhythm: Normal rate and regular rhythm.     Heart sounds: Normal heart sounds.  Pulmonary:     Effort: Pulmonary effort is normal.     Breath sounds: Normal breath sounds.  Chest:     Chest wall: Tenderness present.  Abdominal:     General: A surgical scar is present. Bowel sounds are normal.     Tenderness: There is no abdominal tenderness.     Comments: Incision site healing well   Genitourinary:    Uterus: Normal.   Skin:    General: Skin is warm and dry.     Capillary Refill: Capillary refill takes less than 2 seconds.  Neurological:     Mental Status: She is alert and oriented to person, place, and time.  Psychiatric:        Mood and Affect: Mood normal.        Behavior: Behavior normal.      ED Course  Assessment: Possible PE  Plan: - CXR clear, bloodwork normal except d-dimer elevated - CTA ordered, pt aware and amenable to additional testing - Care turned over to Gerrit Heck, CNM at 2315 06/28/20   Reassessment (12:20 AM)  -CTA returns negative for PE. -Results discussed with Dr. Lonia Farber who  advises: *Patient utilize incentive spirometry. *Follow up in office in one week as scheduled. -Provider to bedside to discuss results and POC with patient. -Reviewed proper management of medications. Instructed to take ibuprofen q6hrs and Oxycodone prior to anticipated activities-extended walking or car rides. -Renewed script for Oxycodone 5mg  Disp 10, RF 0 -Reviewed usage of IS at home-Q1hr. -Reviewed need for increased hydration. -Reassured that rest and respite are necessary after major abdominal surgery! -Patient verbalizes understanding and without further questions or concerns. -Encouraged to call or return to MAU if symptoms worsen or with the onset of new symptoms. -Discharged to home in stable condition.  MSN, CNM Advanced Practice Provider, Center for [facility]

## 2020-06-28 NOTE — MAU Note (Signed)
[name] is a 29 y.o.   PP  here in MAU reporting: swelling in legs and feet, abd pain and tingling at section site, pt reports having right sided chest pain that started lastnight.

## 2020-06-29 MED ORDER — OXYCODONE HCL 5 MG PO TABS
5.0000 mg | ORAL_TABLET | Freq: Four times a day (QID) | ORAL | 0 refills | Status: AC | PRN
Start: 2020-06-29 — End: 2020-07-02

## 2020-06-29 NOTE — Discharge Instructions (Signed)

## 2020-07-04 ENCOUNTER — Ambulatory Visit (INDEPENDENT_AMBULATORY_CARE_PROVIDER_SITE_OTHER): Payer: BC Managed Care – PPO | Admitting: *Deleted

## 2020-07-04 ENCOUNTER — Other Ambulatory Visit: Payer: Self-pay

## 2020-07-04 ENCOUNTER — Encounter: Payer: Self-pay | Admitting: *Deleted

## 2020-07-04 VITALS — BP 116/81 | HR 85 | Temp 98.8°F | Wt 118.0 lb

## 2020-07-04 DIAGNOSIS — N179 Acute kidney failure, unspecified: Secondary | ICD-10-CM

## 2020-07-04 NOTE — Progress Notes (Signed)
° °  Subjective:     [name] is a 29 y.o. female who presents to the clinic 1 weeks status post low transerve cesarean for Arrest of Decent/Labor of Induction on 06/24/2020. Eating a regular diet without difficulty. Bowel movements are abnormal with constipation. Pain is not well controlled.  Medications being used: acetaminophen.  The following portions of the patient's history were reviewed and updated as appropriate: allergies, current medications, past medical history, past surgical history and problem list.  Review of Systems Pertinent items are noted in HPI.    Objective:    BP 116/81 (BP Location: Left Arm, Patient Position: Sitting, Cuff Size: Normal)    Pulse 85    Temp 98.8 F (37.1 C) (Oral)    Wt 118 lb (53.5 kg)    Breastfeeding Yes    BMI 22.30 kg/m  General:  alert, cooperative and appears stated age  Abdomen: soft, non-tender  Incision:   healing well, no drainage, no erythema, no hernia, no seroma, no swelling, no dehiscence, incision well approximated     Assessment:    Doing well postoperatively. Complaints of constipation.   Plan:    1. Continue any current medications. 2. Wound care discussed. Honeycomb dressing removed. 3. Activity restrictions: no lifting more than 15-20 pounds 4. Anticipated return to work: not applicable. 5. Follow up: 4 weeks for postpartum.   6. Advised to try Fibercon, increase fluid intake, Prune/apple juice and fiber intake in her diet. 7. BMP completed due to AKI.  Clovis Pu, RN

## 2020-07-05 LAB — BASIC METABOLIC PANEL
BUN/Creatinine Ratio: 10 (ref 9–23)
BUN: 8 mg/dL (ref 6–20)
CO2: 21 mmol/L (ref 20–29)
Calcium: 9.6 mg/dL (ref 8.7–10.2)
Chloride: 105 mmol/L (ref 96–106)
Creatinine, Ser: 0.79 mg/dL (ref 0.57–1.00)
GFR calc Af Amer: 118 mL/min/{1.73_m2} (ref >59)
GFR calc non Af Amer: 102 mL/min/{1.73_m2} (ref >59)
Glucose: 75 mg/dL (ref 65–99)
Potassium: 5.3 mmol/L — ABNORMAL HIGH (ref 3.5–5.2)
Sodium: 141 mmol/L (ref 134–144)

## 2020-08-06 ENCOUNTER — Other Ambulatory Visit: Payer: Self-pay

## 2020-08-06 ENCOUNTER — Encounter: Payer: Self-pay | Admitting: Obstetrics and Gynecology

## 2020-08-06 ENCOUNTER — Ambulatory Visit (INDEPENDENT_AMBULATORY_CARE_PROVIDER_SITE_OTHER): Payer: BC Managed Care – PPO | Admitting: Obstetrics and Gynecology

## 2020-08-06 DIAGNOSIS — Z01812 Encounter for preprocedural laboratory examination: Secondary | ICD-10-CM | POA: Diagnosis not present

## 2020-08-06 DIAGNOSIS — Z3042 Encounter for surveillance of injectable contraceptive: Secondary | ICD-10-CM | POA: Diagnosis not present

## 2020-08-06 LAB — POCT URINE PREGNANCY: Preg Test, Ur: NEGATIVE

## 2020-08-06 MED ORDER — MEDROXYPROGESTERONE ACETATE 150 MG/ML IM SUSP
150.0000 mg | INTRAMUSCULAR | Status: AC
  Administered 2020-08-06 – 2021-07-13 (×4): 150 mg via INTRAMUSCULAR

## 2020-08-06 NOTE — Progress Notes (Signed)
   Subjective:  Pt in for Depo Provera injection.    Objective: Need for contraception. No unusual complaints.    Assessment: Pt tolerated Depo injection. Depo given Right upper outer quadrant.   Plan:  Next injection due Jan. 19-Nov 05, 2020.    Clovis Pu, RN

## 2020-08-06 NOTE — Progress Notes (Signed)
Post Partum Visit Note  [name] is a 29 y.o. G26P1001 female who presents for a postpartum visit. She is 6 weeks postpartum following a primary cesarean section.  I have fully reviewed the prenatal and intrapartum course. The delivery was at 40.2 gestational weeks.  Anesthesia: epidural. Postpartum course has been uncomplicated. Baby is doing well. Baby is feeding by breast. Bleeding no bleeding. Bowel function is normal. Bladder function is normal. Patient is not sexually active. Contraception method is none. Postpartum depression screening: negative: score 4.   The pregnancy intention screening data noted above was reviewed. Potential methods of contraception were discussed. The patient elected to proceed with Hormonal Injection.    Edinburgh Postnatal Depression Scale - 08/06/20 1403      Edinburgh Postnatal Depression Scale:  In the Past 7 Days   I have been able to laugh and see the funny side of things. 0    I have looked forward with enjoyment to things. 1    I have blamed myself unnecessarily when things went wrong. 1    I have been anxious or worried for no good reason. 1    I have felt scared or panicky for no good reason. 0    Things have been getting on top of me. 1    I have been so unhappy that I have had difficulty sleeping. 0    I have felt sad or miserable. 0    I have been so unhappy that I have been crying. 0    The thought of harming myself has occurred to me. 0    Edinburgh Postnatal Depression Scale Total 4            The following portions of the patient's history were reviewed and updated as appropriate: allergies, current medications, past family history, past medical history, past social history, past surgical history and problem list.  Review of Systems Constitutional: negative Eyes: negative Ears, nose, mouth, throat, and face: negative Respiratory: negative Cardiovascular: negative Gastrointestinal:  negative Genitourinary:negative Integument/breast: negative Hematologic/lymphatic: negative Musculoskeletal:negative Neurological: negative Behavioral/Psych: negative Endocrine: negative Allergic/Immunologic: negative    Objective:  Blood pressure 105/70, pulse 65, temperature 97.9 F (36.6 C), temperature source Oral, height 5\' 1"  (1.549 m), weight 112 lb 3.2 oz (50.9 kg), currently breastfeeding.  General:  alert, cooperative and no distress   Breasts:  inspection negative, no nipple discharge or bleeding, no masses or nodularity palpable  Lungs: clear to auscultation bilaterally  Heart:  regular rate and rhythm, S1, S2 normal, no murmur, click, rub or gallop  Abdomen: soft, non-tender; bowel sounds normal; no masses,  no organomegaly cesarean incision edges well-approximated and well-healed   Vulva:  not evaluated  Vagina: not evaluated  Cervix:  not evaluated  Corpus: not examined  Adnexa:  not evaluated  Rectal Exam: Not performed.        Assessment:  Encounter for postpartum care of lactating mother - Normal postpartum exam. Pap smear not done at today's visit.  Family planning, Depo-Provera contraception monitoring/administration  - POCT urine pregnancy,  - medroxyPROGESTERone (DEPO-PROVERA) injection 150 mg     Plan:   Essential components of care per ACOG recommendations:  1.  Mood and well being: Patient with negative depression screening today. Reviewed local resources for support.  - Patient does not use tobacco.  - hx of drug use? No    2. Infant care and feeding:  -Patient currently breastmilk feeding? Yes If breastmilk feeding discussed return to work and pumping.  If needed, patient was provided letter for work to allow for every 2-3 hr pumping breaks, and to be granted a private location to express breastmilk and refrigerated area to store breastmilk. Reviewed importance of draining breast regularly to support lactation. -Social determinants of health  (SDOH) reviewed in EPIC. No concerns  3. Sexuality, contraception and birth spacing - Patient does not want a pregnancy in the next year.  Desired family size is unsure of number of children.  - Reviewed forms of contraception in tiered fashion. Patient desired Depo-Provera today.   - Discussed birth spacing of 18 months  4. Sleep and fatigue -Encouraged family/partner/community support of 4 hrs of uninterrupted sleep to help with mood and fatigue  5. Physical Recovery  - Discussed patients delivery and complications - Patient had a primary cesarean delivery due to FTD, postoperative healing reviewed. Patient expressed understanding - Patient has urinary incontinence? No  - Patient is safe to resume physical and sexual activity  6.  Health Maintenance - Last pap smear done 03/16/2019 and was normal. Mammogram at age 43  7. No Chronic Disease - PCP follow up  Raelyn Mora, CNM Center for [employer], [facility]

## 2020-08-06 NOTE — Patient Instructions (Addendum)
Medroxyprogesterone injection [Contraceptive] What is this medicine? MEDROXYPROGESTERONE (me DROX ee proe JES te rone) contraceptive injections prevent pregnancy. They provide effective birth control for 3 months. Depo-subQ Provera 104 is also used for treating pain related to endometriosis. This medicine may be used for other purposes; ask your health care provider or pharmacist if you have questions. COMMON BRAND NAME(S): Depo-Provera, Depo-subQ Provera 104 What should I tell my health care provider before I take this medicine? They need to know if you have any of these conditions:  frequently drink alcohol  asthma  blood vessel disease or a history of a blood clot in the lungs or legs  bone disease such as osteoporosis  breast cancer  diabetes  eating disorder (anorexia nervosa or bulimia)  high blood pressure  HIV infection or AIDS  kidney disease  liver disease  mental depression  migraine  seizures (convulsions)  stroke  tobacco smoker  vaginal bleeding  an unusual or allergic reaction to medroxyprogesterone, other hormones, medicines, foods, dyes, or preservatives  pregnant or trying to get pregnant  breast-feeding How should I use this medicine? Depo-Provera Contraceptive injection is given into a muscle. Depo-subQ Provera 104 injection is given under the skin. These injections are given by a health care professional. You must not be pregnant before getting an injection. The injection is usually given during the first 5 days after the start of a menstrual period or 6 weeks after delivery of a baby. Talk to your pediatrician regarding the use of this medicine in children. Special care may be needed. These injections have been used in female children who have started having menstrual periods. Overdosage: If you think you have taken too much of this medicine contact a poison control center or emergency room at once. NOTE: This medicine is only for you. Do not  share this medicine with others. What if I miss a dose? Try not to miss a dose. You must get an injection once every 3 months to maintain birth control. If you cannot keep an appointment, call and reschedule it. If you wait longer than 13 weeks between Depo-Provera contraceptive injections or longer than 14 weeks between Depo-subQ Provera 104 injections, you could get pregnant. Use another method for birth control if you miss your appointment. You may also need a pregnancy test before receiving another injection. What may interact with this medicine? Do not take this medicine with any of the following medications:  bosentan This medicine may also interact with the following medications:  aminoglutethimide  antibiotics or medicines for infections, especially rifampin, rifabutin, rifapentine, and griseofulvin  aprepitant  barbiturate medicines such as phenobarbital or primidone  bexarotene  carbamazepine  medicines for seizures like ethotoin, felbamate, oxcarbazepine, phenytoin, topiramate  modafinil  St. John's wort This list may not describe all possible interactions. Give your health care provider a list of all the medicines, herbs, non-prescription drugs, or dietary supplements you use. Also tell them if you smoke, drink alcohol, or use illegal drugs. Some items may interact with your medicine. What should I watch for while using this medicine? This drug does not protect you against HIV infection (AIDS) or other sexually transmitted diseases. Use of this product may cause you to lose calcium from your bones. Loss of calcium may cause weak bones (osteoporosis). Only use this product for more than 2 years if other forms of birth control are not right for you. The longer you use this product for birth control the more likely you will be at risk   for weak bones. Ask your health care professional how you can keep strong bones. You may have a change in bleeding pattern or irregular periods.  Many females stop having periods while taking this drug. If you have received your injections on time, your chance of being pregnant is very low. If you think you may be pregnant, see your health care professional as soon as possible. Tell your health care professional if you want to get pregnant within the next year. The effect of this medicine may last a long time after you get your last injection. What side effects may I notice from receiving this medicine? Side effects that you should report to your doctor or health care professional as soon as possible:  allergic reactions like skin rash, itching or hives, swelling of the face, lips, or tongue  breast tenderness or discharge  breathing problems  changes in vision  depression  feeling faint or lightheaded, falls  fever  pain in the abdomen, chest, groin, or leg  problems with balance, talking, walking  unusually weak or tired  yellowing of the eyes or skin Side effects that usually do not require medical attention (report to your doctor or health care professional if they continue or are bothersome):  acne  fluid retention and swelling  headache  irregular periods, spotting, or absent periods  temporary pain, itching, or skin reaction at site where injected  weight gain This list may not describe all possible side effects. Call your doctor for medical advice about side effects. You may report side effects to FDA at 1-800-FDA-1088. Where should I keep my medicine? This does not apply. The injection will be given to you by a health care professional. NOTE: This sheet is a summary. It may not cover all possible information. If you have questions about this medicine, talk to your doctor, pharmacist, or health care provider.  2020 Elsevier/Gold Standard (2008-10-11 18:37:56)  

## 2020-08-11 ENCOUNTER — Encounter: Payer: Self-pay | Admitting: Obstetrics and Gynecology

## 2020-09-03 ENCOUNTER — Ambulatory Visit: Payer: BC Managed Care – PPO | Admitting: Cardiovascular Disease

## 2020-09-27 ENCOUNTER — Emergency Department (HOSPITAL_COMMUNITY)
Admission: EM | Admit: 2020-09-27 | Discharge: 2020-09-27 | Disposition: A | Discharge status: Home / Self Care | Payer: BC Managed Care – PPO | Attending: Emergency Medicine | Admitting: Emergency Medicine

## 2020-09-27 ENCOUNTER — Other Ambulatory Visit: Payer: Self-pay

## 2020-09-27 ENCOUNTER — Encounter (HOSPITAL_COMMUNITY): Payer: Self-pay

## 2020-09-27 DIAGNOSIS — R519 Headache, unspecified: Secondary | ICD-10-CM | POA: Diagnosis present

## 2020-09-27 DIAGNOSIS — R000 Tachycardia, unspecified: Secondary | ICD-10-CM | POA: Insufficient documentation

## 2020-09-27 DIAGNOSIS — U071 COVID-19: Secondary | ICD-10-CM | POA: Insufficient documentation

## 2020-09-27 LAB — RESP PANEL BY RT-PCR (FLU A&B, COVID) ARPGX2
Influenza A by PCR: NEGATIVE
Influenza B by PCR: NEGATIVE
SARS Coronavirus 2 by RT PCR: POSITIVE — AB

## 2020-09-27 MED ORDER — ONDANSETRON 4 MG PO TBDP
4.0000 mg | ORAL_TABLET | Freq: Once | ORAL | Status: AC
  Administered 2020-09-27: 18:00:00 4 mg via ORAL
  Filled 2020-09-27: qty 1

## 2020-09-27 MED ORDER — ACETAMINOPHEN 325 MG PO TABS
650.0000 mg | ORAL_TABLET | Freq: Once | ORAL | Status: AC
  Administered 2020-09-27: 18:00:00 650 mg via ORAL
  Filled 2020-09-27: qty 2

## 2020-09-27 NOTE — ED Provider Notes (Signed)
[facility] EMERGENCY DEPARTMENT Provider Note   CSN: [id number] Arrival date & time: 09/27/20  1730     History Chief Complaint  Patient presents with  . Fever  . Nausea  . Headache    [name] is a 29 y.o. female.  Patient is a 29 year old female with a history of recent delivery currently with a 95-month-old baby and is currently breast-feeding who presents today with a headache, chills and fever of 101.5.  Patient reports she was feeling fine until today when she was heading towards a family member's house for Christmas when she suddenly started feeling bad.  She denies any cough, congestion, shortness of breath, vomiting, abdominal pain or diarrhea.  She has not received her Covid vaccine and reported that she was scheduled to get it next week.  She is unaware of anybody else with sick contacts.  The history is provided by the patient.  Fever Associated symptoms: headaches   Headache Associated symptoms: fever        Past Medical History:  Diagnosis Date  . Anemia   . Anxiety   . Costochondritis   . HSV-2 infection   . Vaginal Pap smear, abnormal     Patient Active Problem List   Diagnosis Date Noted  . Acute kidney injury (HCC) 06/26/2020  . Arrest of descent, delivered, current hospitalization 06/24/2020  . Cesarean delivery delivered 06/24/2020  . Indication for care in labor and delivery, antepartum 06/23/2020  . Vasovagal syncope 05/07/2020  . Supervision of normal first pregnancy, antepartum 11/19/2019  . 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 CESAREAN SECTION N/A 06/24/2020   Procedure: CESAREAN SECTION;  Surgeon: Lazaro Arms, MD;  Location: [facility] LD ORS;  Service: Obstetrics;  Laterality: N/A;  . COLPOSCOPY     [facility]. Dareen Piano at [facility] 1.5-2 years ago     OB History    Gravida  1   Para  1   Term  1   Preterm      AB      Living  1     SAB      IAB       Ectopic      Multiple  0   Live Births  1           Family History  Problem Relation Age of Onset  . Ovarian cancer Paternal Aunt   . Diabetes Maternal Grandmother     Social History   Tobacco Use  . Smoking status: Never Smoker  . Smokeless tobacco: Never Used  Vaping Use  . Vaping Use: Never used  Substance Use Topics  . Alcohol use: Not Currently  . Drug use: No    Home Medications Prior to Admission medications   Medication Sig Start Date End Date Taking? Authorizing Provider  acetaminophen (TYLENOL) 500 MG tablet Take 2 tablets (1,000 mg total) by mouth every 6 (six) hours. 06/26/20   Calvert Cantor, CNM  Blood Pressure Monitoring (BLOOD PRESSURE MONITOR AUTOMAT) DEVI 1 Device by Does not apply route daily. Automatic blood pressure cuff regular size. To monitor blood pressure regularly at home. ICD-10 code: O11.90 Patient not taking: Reported on 08/06/2020 11/29/19   Reva Bores, MD  ferrous sulfate 325 (65 FE) MG tablet Take 1 tablet (325 mg total) by mouth every other day. Patient not taking: Reported on 05/30/2020 03/25/20 06/23/20  Gerrit Heck, CNM  Misc. Devices (GOJJI WEIGHT SCALE) MISC 1  Device by Does not apply route daily as needed. To weight self daily as needed at home. ICD-10 code: O09.90 11/29/19   Reva Bores, MD  Prenatal Vit-Fe Fumarate-FA (MULTIVITAMIN-PRENATAL) 27-0.8 MG TABS tablet Take 1 tablet by mouth daily at 12 noon. Patient not taking: Reported on 08/06/2020    [provider]  valACYclovir (VALTREX) 500 MG tablet Take 1 tablet (500 mg total) by mouth 2 (two) times daily. 05/30/20   Gerrit Heck, CNM    Allergies    Patient has no known allergies.  Review of Systems   Review of Systems  Constitutional: Positive for fever.  Neurological: Positive for headaches.  All other systems reviewed and are negative.   Physical Exam Updated Vital Signs BP 116/68 (BP Location: Right Arm)   Pulse (!) 116   Temp (!) 101.5 F (38.6  C) (Oral)   Resp 16   SpO2 98%   Physical Exam Vitals and nursing note reviewed.  Constitutional:      General: She is not in acute distress.    Appearance: She is well-developed, normal weight and well-nourished.  HENT:     Head: Normocephalic and atraumatic.  Eyes:     Extraocular Movements: EOM normal.     Pupils: Pupils are equal, round, and reactive to light.  Cardiovascular:     Rate and Rhythm: Regular rhythm. Tachycardia present.     Pulses: Intact distal pulses.     Heart sounds: Normal heart sounds. No murmur heard. No friction rub.  Pulmonary:     Effort: Pulmonary effort is normal.     Breath sounds: Normal breath sounds. No wheezing or rales.  Abdominal:     General: Bowel sounds are normal. There is no distension.     Palpations: Abdomen is soft.     Tenderness: There is no abdominal tenderness. There is no guarding or rebound.  Musculoskeletal:        General: No tenderness. Normal range of motion.     Comments: No edema  Skin:    General: Skin is warm and dry.     Findings: No rash.  Neurological:     Mental Status: She is alert and oriented to person, place, and time.     Cranial Nerves: No cranial nerve deficit.  Psychiatric:        Mood and Affect: Mood and affect and mood normal.        Behavior: Behavior normal.      ED Results / Procedures / Treatments   Labs (all labs ordered are listed, but only abnormal results are displayed) Labs Reviewed  RESP PANEL BY RT-PCR (FLU A&B, COVID) ARPGX2 - Abnormal; Notable for the following components:      Result Value   SARS Coronavirus 2 by RT PCR POSITIVE (*)    All other components within normal limits    EKG None  Radiology No results found.  Procedures Procedures (including critical care time)  Medications Ordered in ED Medications  acetaminophen (TYLENOL) tablet 650 mg (650 mg Oral Given 09/27/20 1806)  ondansetron (ZOFRAN-ODT) disintegrating tablet 4 mg (4 mg Oral Given 09/27/20 1806)     ED Course  I have reviewed the triage vital signs and the nursing notes.  Pertinent labs & imaging results that were available during my care of the patient were reviewed by me and considered in my medical decision making (see chart for details).    MDM Rules/Calculators/A&P  Patient presenting today with sudden onset of headache, fever and general malaise.  She is otherwise well-appearing.  Patient had a temperature of 101.5 upon arrival here and some mild tachycardia which is most likely related to fever.  She is satting 98% on room air and has no other acute findings.  Patient has not received the vaccine and today did test positive for Covid.  Discussed with her trying to at least wear a mask around her 17-month-old however if he develops a fever he needs to be tested as well.  She was encouraged to continue breast-feeding.  MDM Number of Diagnoses or Management Options   Amount and/or Complexity of Data Reviewed Clinical lab tests: ordered and reviewed Independent visualization of images, tracings, or specimens: yes  [name] was evaluated in Emergency Department on 09/27/2020 for the symptoms described in the history of present illness. She was evaluated in the context of the global COVID-19 pandemic, which necessitated consideration that the patient might be at risk for infection with the SARS-CoV-2 virus that causes COVID-19. Institutional protocols and algorithms that pertain to the evaluation of patients at risk for COVID-19 are in a state of rapid change based on information released by regulatory bodies including the CDC and federal and state organizations. These policies and algorithms were followed during the patient's care in the ED.  Final Clinical Impression(s) / ED Diagnoses Final diagnoses:  COVID    Rx / DC Orders ED Discharge Orders    None       Gwyneth Sprout, MD 09/27/20 1919

## 2020-09-27 NOTE — ED Triage Notes (Signed)
Pt reports headache, fever and nausea that started earlier today. Denies any COVId vaccines.

## 2020-09-27 NOTE — Discharge Instructions (Addendum)
You should try to mask around your baby to try to prevent spread however if he develops any congestion or fever he should be tested.  Make sure you are drinking plenty of fluids and take Tylenol or ibuprofen as needed for fever and headache.

## 2020-10-27 ENCOUNTER — Other Ambulatory Visit: Payer: Self-pay

## 2020-10-27 ENCOUNTER — Ambulatory Visit (INDEPENDENT_AMBULATORY_CARE_PROVIDER_SITE_OTHER): Payer: Medicaid Other | Admitting: *Deleted

## 2020-10-27 VITALS — BP 113/78 | HR 71 | Temp 98.0°F | Ht 61.0 in | Wt 113.4 lb

## 2020-10-27 DIAGNOSIS — Z3042 Encounter for surveillance of injectable contraceptive: Secondary | ICD-10-CM | POA: Diagnosis not present

## 2020-10-27 NOTE — Progress Notes (Signed)
   Subjective:  Pt in for Depo Provera injection.    Objective: Need for contraception. No unusual complaints.    Assessment: Pt tolerated Depo injection. Depo given Left upper outer quadrant.   Plan:  Next injection due April 11-25, 2022.    Clovis Pu, RN

## 2020-12-05 ENCOUNTER — Ambulatory Visit: Payer: Medicaid Other | Admitting: Advanced Practice Midwife

## 2021-01-19 ENCOUNTER — Other Ambulatory Visit: Payer: Self-pay | Admitting: *Deleted

## 2021-01-19 ENCOUNTER — Ambulatory Visit: Payer: Medicaid Other

## 2021-01-19 DIAGNOSIS — Z3042 Encounter for surveillance of injectable contraceptive: Secondary | ICD-10-CM

## 2021-01-19 MED ORDER — MEDROXYPROGESTERONE ACETATE 150 MG/ML IM SUSP: 150.0000 mg | INTRAMUSCULAR | 0 refills | Status: DC

## 2021-01-19 NOTE — Progress Notes (Signed)
Rx for Depo Provera sent to pharmacy for upcoming appointment.  Clovis Pu, RN

## 2021-01-22 ENCOUNTER — Other Ambulatory Visit (HOSPITAL_COMMUNITY)
Admission: RE | Admit: 2021-01-22 | Discharge: 2021-01-22 | Disposition: A | Discharge status: Home / Self Care | Payer: Medicaid Other | Source: Ambulatory Visit | Attending: Obstetrics and Gynecology | Admitting: Obstetrics and Gynecology

## 2021-01-22 ENCOUNTER — Ambulatory Visit (INDEPENDENT_AMBULATORY_CARE_PROVIDER_SITE_OTHER): Payer: Medicaid Other | Admitting: Obstetrics and Gynecology

## 2021-01-22 ENCOUNTER — Other Ambulatory Visit: Payer: Self-pay

## 2021-01-22 ENCOUNTER — Encounter: Payer: Self-pay | Admitting: Obstetrics and Gynecology

## 2021-01-22 VITALS — BP 119/87 | HR 101 | Temp 99.1°F | Ht 61.0 in | Wt 119.4 lb

## 2021-01-22 DIAGNOSIS — Z113 Encounter for screening for infections with a predominantly sexual mode of transmission: Secondary | ICD-10-CM

## 2021-01-22 DIAGNOSIS — N898 Other specified noninflammatory disorders of vagina: Secondary | ICD-10-CM | POA: Insufficient documentation

## 2021-01-22 DIAGNOSIS — Z0000 Encounter for general adult medical examination without abnormal findings: Secondary | ICD-10-CM | POA: Insufficient documentation

## 2021-01-22 DIAGNOSIS — Z3042 Encounter for surveillance of injectable contraceptive: Secondary | ICD-10-CM | POA: Diagnosis not present

## 2021-01-22 MED ORDER — MEDROXYPROGESTERONE ACETATE 150 MG/ML IM SUSP: 150.0000 mg | INTRAMUSCULAR | 3 refills | Status: DC

## 2021-01-22 NOTE — Progress Notes (Signed)
   Subjective:  Pt in for Depo Provera injection and annual.    Objective: Need for contraception. No unusual complaints.    Assessment: Pt tolerated Depo injection. Depo given Right upper outer quadrant.   Plan:  Next injection due July 7-21, 2022.    Clovis Pu, RN

## 2021-01-23 ENCOUNTER — Other Ambulatory Visit: Payer: Self-pay | Admitting: Obstetrics and Gynecology

## 2021-01-23 ENCOUNTER — Encounter: Payer: Self-pay | Admitting: Obstetrics and Gynecology

## 2021-01-23 DIAGNOSIS — B373 Candidiasis of vulva and vagina: Secondary | ICD-10-CM

## 2021-01-23 DIAGNOSIS — B3731 Acute candidiasis of vulva and vagina: Secondary | ICD-10-CM

## 2021-01-23 LAB — CERVICOVAGINAL ANCILLARY ONLY
Bacterial Vaginitis (gardnerella): NEGATIVE
Candida Glabrata: NEGATIVE
Candida Vaginitis: POSITIVE — AB
Chlamydia: NEGATIVE
Comment: NEGATIVE
Comment: NEGATIVE
Comment: NEGATIVE
Comment: NEGATIVE
Comment: NEGATIVE
Comment: NORMAL
Neisseria Gonorrhea: NEGATIVE
Trichomonas: NEGATIVE

## 2021-01-23 MED ORDER — FLUCONAZOLE 150 MG PO TABS: 150.0000 mg | ORAL_TABLET | ORAL | 1 refills | Status: DC

## 2021-01-23 NOTE — Progress Notes (Signed)
   WELL-WOMAN PHYSICAL & PAP Patient name: [name] MRN [id number]  Date of birth: May 01, 1991 Chief Complaint:   Gynecologic Exam  History of Present Illness:   [name] is a 30 y.o. G40P1001 African American female being seen today for a routine well-woman exam. 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 Her son is now 46 months old, just started daycare outside of the home and doing well. Current complaints: vaginal discharge and itching  PCP: none      does desire labs No LMP recorded. Patient has had an injection. The current method of family planning is Depo-Provera injections.  Last pap 03/16/2019. Results were: normal with Neg HRHPV Last mammogram: n/a. Results were: n/a. Family h/o breast cancer: No Last colonoscopy: n/a. Results were: n/a. Family h/o colorectal cancer: No Review of Systems:   Pertinent items are noted in HPI Denies any headaches, blurred vision, fatigue, shortness of breath, chest pain, abdominal pain, abnormal vaginal discharge/itching/odor/irritation, problems with periods, bowel movements, urination, or intercourse unless otherwise stated above. Pertinent History Reviewed:  Reviewed past medical,surgical, social and family history.  Reviewed problem list, medications and allergies. Physical Assessment:   Vitals:   01/22/21 0921  BP: 119/87  Pulse: (!) 101  Temp: 99.1 F (37.3 C)  TempSrc: Oral  Weight: 119 lb 6.4 oz (54.2 kg)  Height: 5\' 1"  (1.549 m)  Body mass index is 22.56 kg/m.        Physical Examination:   General appearance - well appearing, and in no distress  Mental status - alert, oriented to person, place, and time  Psych:  She has a normal mood and affect  Skin - warm and dry, normal color, no suspicious lesions noted  Chest - effort normal, all lung fields clear to auscultation bilaterally  Heart -  normal rate and regular rhythm  Neck:  midline trachea, no thyromegaly or nodules  Breasts - breasts appear normal, no suspicious masses, no skin or nipple changes or  axillary nodes  Abdomen - soft, nontender, nondistended, no masses or organomegaly  Pelvic - deferred  Extremities:  No swelling or varicosities noted  No results found for this or any previous visit (from the past 24 hour(s)).  Assessment & Plan:  1) Encounter for annual physical examination excluding gynecological examination in a patient older than 17 years  - Wet prep done by patient (self-swab)  2) Family planning, Depo-Provera contraception monitoring/administration  -  medroxyPROGESTERone (DEPO-PROVERA) 150 MG/ML injection  3) Vaginal discharge  - Cervicovaginal ancillary only( [place])  4) Vaginal itching  - Cervicovaginal ancillary only( [place])  5) Screen for STD (sexually transmitted disease)  - Cervicovaginal ancillary only( [place])    Labs/procedures today: wet prep  Mammogram at age 89 or sooner if problems Colonoscopy at age 73 or sooner if problems  No orders of the defined types were placed in this encounter.   Meds:  Meds ordered this encounter  Medications  . medroxyPROGESTERone (DEPO-PROVERA) 150 MG/ML injection    Sig: Inject 1 mL (150 mg total) into the muscle every 3 (three) months.    Dispense:  1 mL    Refill:  3    Follow-up: Return in about 60 weeks (around 03/18/2022) for Annual Exam with Pap.  03/20/2022 MSN, CNM 01/22/2021 10:44 AM

## 2021-01-30 ENCOUNTER — Other Ambulatory Visit: Payer: Self-pay | Admitting: *Deleted

## 2021-01-30 DIAGNOSIS — B373 Candidiasis of vulva and vagina: Secondary | ICD-10-CM

## 2021-01-30 DIAGNOSIS — B3731 Acute candidiasis of vulva and vagina: Secondary | ICD-10-CM

## 2021-01-30 MED ORDER — FLUCONAZOLE 150 MG PO TABS: 150.0000 mg | ORAL_TABLET | ORAL | 1 refills | Status: DC

## 2021-02-12 ENCOUNTER — Other Ambulatory Visit: Payer: Self-pay | Admitting: *Deleted

## 2021-02-13 MED ORDER — BREAST PUMP MISC: 1.0000 | Freq: Every day | 0 refills | Status: DC

## 2021-02-13 NOTE — Progress Notes (Signed)
Patient requested Rx for breast pump.    Clovis Pu, RN

## 2021-02-20 ENCOUNTER — Other Ambulatory Visit: Payer: Self-pay

## 2021-02-20 ENCOUNTER — Encounter (HOSPITAL_COMMUNITY): Payer: Self-pay

## 2021-02-20 ENCOUNTER — Emergency Department (HOSPITAL_COMMUNITY)
Admission: EM | Admit: 2021-02-20 | Discharge: 2021-02-20 | Disposition: A | Discharge status: Home / Self Care | Payer: Medicaid Other | Attending: Emergency Medicine | Admitting: Emergency Medicine

## 2021-02-20 DIAGNOSIS — J3489 Other specified disorders of nose and nasal sinuses: Secondary | ICD-10-CM | POA: Diagnosis not present

## 2021-02-20 DIAGNOSIS — H6690 Otitis media, unspecified, unspecified ear: Secondary | ICD-10-CM

## 2021-02-20 DIAGNOSIS — R630 Anorexia: Secondary | ICD-10-CM | POA: Diagnosis not present

## 2021-02-20 DIAGNOSIS — J019 Acute sinusitis, unspecified: Secondary | ICD-10-CM | POA: Diagnosis not present

## 2021-02-20 DIAGNOSIS — R519 Headache, unspecified: Secondary | ICD-10-CM | POA: Diagnosis present

## 2021-02-20 DIAGNOSIS — H6693 Otitis media, unspecified, bilateral: Secondary | ICD-10-CM | POA: Insufficient documentation

## 2021-02-20 MED ORDER — AMOXICILLIN-POT CLAVULANATE 875-125 MG PO TABS: 1.0000 | ORAL_TABLET | Freq: Two times a day (BID) | ORAL | 0 refills | Status: DC

## 2021-02-20 NOTE — Discharge Instructions (Addendum)
Take Augmentin with food twice daily as directed for ear infection.  To help with sinus congestion you can use Sudafed, you can also use saline nasal rinses or nasal sprays to help reduce congestion.  If you develop fevers or worsening symptoms return to the ED or follow-up with your primary care provider for reevaluation.

## 2021-02-20 NOTE — ED Notes (Signed)
C/o headache for several days , states she is nasally congested. C/o cough. C/o decreased appetite.

## 2021-02-20 NOTE — ED Triage Notes (Signed)
Pt is here d/t headache and L facial pain that all started 1 week ago. Vitals are stable.

## 2021-02-20 NOTE — ED Provider Notes (Signed)
Emergency Medicine Provider Triage Evaluation Note  [name] , a 30 y.o. female  was evaluated in triage.  Pt complains of cough and congestion x 5 days, no relief with OTC meds, negative COVID/flu yesterday.  Review of Systems  Positive: Cough, congestion  Negative: fever  Physical Exam  BP (!) 118/96 (BP Location: Left Arm)   Pulse 99   Temp 99.4 F (37.4 C)   Resp 18   Ht 5\' 1"  (1.549 m)   Wt 49.9 kg   SpO2 100%   BMI 20.78 kg/m  Gen:   Awake, no distress   Resp:  Normal effort  MSK:   Moves extremities without difficulty  Other:  Lungs CTA  Medical Decision Making  Medically screening exam initiated at 3:32 PM.  Appropriate orders placed.  [name] was informed that the remainder of the evaluation will be completed by another provider, this initial triage assessment does not replace that evaluation, and the importance of remaining in the ED until their evaluation is complete.     Ellen Saver, PA-C 02/20/21 1534    02/22/21, MD 02/20/21 [phone]

## 2021-02-20 NOTE — ED Provider Notes (Signed)
[facility] EMERGENCY DEPARTMENT Provider Note   CSN: [id number] Arrival date & time: 02/20/21  1253     History No chief complaint on file.   [name] is a 30 y.o. female.  [name] is a 30 y.o. female with history of anemia, anxiety, who presents to the emergency department for evaluation of 6 days of nasal congestion, cough, headache.  Symptoms have been persistent and over the last few days she has had worsening left-sided facial pressure and headaches associated with persistent nasal congestion.  Reports her cough has been improving.  She has not had fevers or chills.  Reports some decreased appetite associated with symptoms but no vomiting or abdominal pain.  No chest pain or shortness of breath.  Was seen at [facility] yesterday and had PCR COVID and flu test done that were both negative.  She reports she is also started to have some pain in her ears worse on the right than the left, no drainage.  No neck pain or stiffness.  She has been trying Mucinex at home without much improvement.  No other aggravating or alleviating factors.  No known sick contacts.        Past Medical History:  Diagnosis Date  . Anemia   . Anxiety   . Costochondritis   . HSV-2 infection   . Vaginal Pap smear, abnormal     Patient Active Problem List   Diagnosis Date Noted  . Acute kidney injury (HCC) 06/26/2020  . Arrest of descent, delivered, current hospitalization 06/24/2020  . Cesarean delivery delivered 06/24/2020  . Indication for care in labor and delivery, antepartum 06/23/2020  . Vasovagal syncope 05/07/2020  . Supervision of normal first pregnancy, antepartum 11/19/2019  . 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 CESAREAN SECTION N/A 06/24/2020   Procedure: CESAREAN SECTION;  Surgeon: Lazaro Arms, MD;  Location: [facility] LD ORS;  Service: Obstetrics;  Laterality: N/A;  . COLPOSCOPY     Dr. Dareen Piano at  [facility] 1.5-2 years ago     OB History    Gravida  1   Para  1   Term  1   Preterm      AB      Living  1     SAB      IAB      Ectopic      Multiple  0   Live Births  1           Family History  Problem Relation Age of Onset  . Ovarian cancer Paternal Aunt   . Diabetes Maternal Grandmother     Social History   Tobacco Use  . Smoking status: Never Smoker  . Smokeless tobacco: Never Used  Vaping Use  . Vaping Use: Never used  Substance Use Topics  . Alcohol use: Not Currently  . Drug use: No    Home Medications Prior to Admission medications   Medication Sig Start Date End Date Taking? Authorizing Provider  amoxicillin-clavulanate (AUGMENTIN) 875-125 MG tablet Take 1 tablet by mouth 2 (two) times daily. One po bid x 7 days 02/20/21  Yes Dartha Lodge, PA-C  acetaminophen (TYLENOL) 500 MG tablet Take 2 tablets (1,000 mg total) by mouth every 6 (six) hours. Patient not taking: No sig reported 06/26/20   Calvert Cantor, CNM  Blood Pressure Monitoring (BLOOD PRESSURE MONITOR AUTOMAT) DEVI 1 Device by Does not apply route daily. Automatic blood  pressure cuff regular size. To monitor blood pressure regularly at home. ICD-10 code: O80.90 Patient not taking: No sig reported 11/29/19   Reva Bores, MD  ferrous sulfate 325 (65 FE) MG tablet Take 1 tablet (325 mg total) by mouth every other day. Patient not taking: Reported on 05/30/2020 03/25/20 06/23/20  Gerrit Heck, CNM  fluconazole (DIFLUCAN) 150 MG tablet Take 1 tablet (150 mg total) by mouth every 3 (three) days. 01/30/21   Raelyn Mora, CNM  medroxyPROGESTERone (DEPO-PROVERA) 150 MG/ML injection Inject 1 mL (150 mg total) into the muscle every 3 (three) months. 01/22/21   Raelyn Mora, CNM  Misc. Devices (BREAST PUMP) MISC 1 Device by Does not apply route daily. Electric Breast pump [id number] Diagnosis code: Z39.1 02/13/21   Raelyn Mora, CNM  Misc. Devices (GOJJI WEIGHT SCALE) MISC 1 Device by  Does not apply route daily as needed. To weight self daily as needed at home. ICD-10 code: O13.90 Patient not taking: No sig reported 11/29/19   Reva Bores, MD  Prenatal Vit-Fe Fumarate-FA (MULTIVITAMIN-PRENATAL) 27-0.8 MG TABS tablet Take 1 tablet by mouth daily at 12 noon. Patient not taking: No sig reported    [provider]  valACYclovir (VALTREX) 500 MG tablet Take 1 tablet (500 mg total) by mouth 2 (two) times daily. 05/30/20   Gerrit Heck, CNM    Allergies    Patient has no known allergies.  Review of Systems   Review of Systems  Constitutional: Negative for chills and fever.  HENT: Positive for congestion, ear pain, rhinorrhea, sinus pressure, sinus pain and sore throat. Negative for ear discharge.   Respiratory: Positive for cough. Negative for shortness of breath.   Cardiovascular: Negative for chest pain.  Gastrointestinal: Negative for abdominal pain, nausea and vomiting.  Musculoskeletal: Negative for myalgias.  Neurological: Positive for headaches.  All other systems reviewed and are negative.   Physical Exam Updated Vital Signs BP (!) 118/96 (BP Location: Left Arm)   Pulse 99   Temp 99.4 F (37.4 C)   Resp 18   Ht 5\' 1"  (1.549 m)   Wt 49.9 kg   SpO2 100%   BMI 20.78 kg/m   Physical Exam Vitals and nursing note reviewed.  Constitutional:      General: She is not in acute distress.    Appearance: Normal appearance. She is well-developed. She is not ill-appearing or diaphoretic.     Comments: Well-appearing and in no distress   HENT:     Head: Normocephalic and atraumatic.     Ears:     Comments: Right TM erythematous and bulging with effusion noted, left TM clear    Nose: Congestion and rhinorrhea present.     Comments: Tenderness to palpation over the frontal sinuses and maxillary sinuses    Mouth/Throat:     Mouth: Mucous membranes are moist.     Pharynx: Oropharynx is clear.     Comments: Posterior oropharynx clear and mucous membranes  moist, there is mild erythema but no edema or tonsillar exudates, uvula midline, normal phonation, no trismus, tolerating secretions without difficulty.  Eyes:     General:        Right eye: No discharge.        Left eye: No discharge.  Neck:     Comments: No rigidity Cardiovascular:     Rate and Rhythm: Normal rate and regular rhythm.     Heart sounds: Normal heart sounds. No murmur heard. No friction rub. No gallop.   Pulmonary:  Effort: Pulmonary effort is normal. No respiratory distress.     Breath sounds: Normal breath sounds. No wheezing or rales.     Comments: Respirations equal and unlabored, patient able to speak in full sentences, lungs clear to auscultation bilaterally  Abdominal:     General: Bowel sounds are normal. There is no distension.     Palpations: Abdomen is soft. There is no mass.     Tenderness: There is no abdominal tenderness. There is no guarding.     Comments: Abdomen soft, nondistended, nontender to palpation in all quadrants without guarding or peritoneal signs  Musculoskeletal:        General: No deformity.     Cervical back: Neck supple.  Lymphadenopathy:     Cervical: No cervical adenopathy.  Skin:    General: Skin is warm and dry.     Capillary Refill: Capillary refill takes less than 2 seconds.  Neurological:     Mental Status: She is alert and oriented to person, place, and time.     Coordination: Coordination normal.     Comments: Speech is clear, able to follow commands Moves extremities without ataxia, coordination intact  Psychiatric:        Mood and Affect: Mood normal.        Behavior: Behavior normal.     ED Results / Procedures / Treatments   Labs (all labs ordered are listed, but only abnormal results are displayed) Labs Reviewed - No data to display  EKG None  Radiology No results found.  Procedures Procedures   Medications Ordered in ED Medications - No data to display  ED Course  I have reviewed the triage  vital signs and the nursing notes.  Pertinent labs & imaging results that were available during my care of the patient were reviewed by me and considered in my medical decision making (see chart for details).    MDM Rules/Calculators/A&P                          30 year old female presents with 6 days of nasal congestion, with worsening sinus pressure over the past 2 to 3 days, improving cough, but persistent headache and facial pain.  Suspect sinusitis.  Patient also noted to have right otitis media on exam.  Will treat with Augmentin for otitis, and discussed symptomatic treatment for nasal congestion and likely sinusitis with Sudafed, nasal saline rinses.  Had negative COVID and flu testing yesterday.  Discussed outpatient follow-up and return precautions.  Patient expresses understanding and agreement with plan.  Discharged home in good condition.  Final Clinical Impression(s) / ED Diagnoses Final diagnoses:  Acute non-recurrent sinusitis, unspecified location  Acute otitis media, unspecified otitis media type    Rx / DC Orders ED Discharge Orders         Ordered    amoxicillin-clavulanate (AUGMENTIN) 875-125 MG tablet  2 times daily        02/20/21 1903           Legrand Rams 02/20/21 1950    Alvira Monday, MD 02/21/21 1200

## 2021-04-13 ENCOUNTER — Ambulatory Visit: Payer: Medicaid Other

## 2021-04-13 ENCOUNTER — Encounter: Payer: Self-pay | Admitting: Obstetrics and Gynecology

## 2021-04-14 ENCOUNTER — Other Ambulatory Visit: Payer: Self-pay

## 2021-04-14 ENCOUNTER — Ambulatory Visit (INDEPENDENT_AMBULATORY_CARE_PROVIDER_SITE_OTHER): Payer: Medicaid Other | Admitting: *Deleted

## 2021-04-14 VITALS — BP 134/89 | HR 105 | Temp 98.7°F | Ht 61.0 in | Wt 114.8 lb

## 2021-04-14 DIAGNOSIS — Z3042 Encounter for surveillance of injectable contraceptive: Secondary | ICD-10-CM

## 2021-04-14 NOTE — Progress Notes (Signed)
   Subjective:  Pt in for Depo Provera injection.    Objective: Need for contraception. No unusual complaints.    Assessment: Pt tolerated Depo injection. Depo given Left upper outer quadrant.   Plan:  Next injection due Sept 27, 2022-Jul 14, 2021.    Clovis Pu, RN

## 2021-06-30 ENCOUNTER — Ambulatory Visit: Payer: Medicaid Other

## 2021-07-12 ENCOUNTER — Other Ambulatory Visit: Payer: Self-pay

## 2021-07-12 ENCOUNTER — Encounter (HOSPITAL_BASED_OUTPATIENT_CLINIC_OR_DEPARTMENT_OTHER): Payer: Self-pay | Admitting: *Deleted

## 2021-07-12 ENCOUNTER — Emergency Department (HOSPITAL_BASED_OUTPATIENT_CLINIC_OR_DEPARTMENT_OTHER)
Admission: EM | Admit: 2021-07-12 | Discharge: 2021-07-12 | Disposition: A | Discharge status: Home / Self Care | Payer: Medicaid Other | Attending: Emergency Medicine | Admitting: Emergency Medicine

## 2021-07-12 ENCOUNTER — Emergency Department (HOSPITAL_BASED_OUTPATIENT_CLINIC_OR_DEPARTMENT_OTHER): Payer: Medicaid Other

## 2021-07-12 DIAGNOSIS — S93601A Unspecified sprain of right foot, initial encounter: Secondary | ICD-10-CM | POA: Insufficient documentation

## 2021-07-12 DIAGNOSIS — W109XXA Fall (on) (from) unspecified stairs and steps, initial encounter: Secondary | ICD-10-CM | POA: Diagnosis not present

## 2021-07-12 DIAGNOSIS — S99921A Unspecified injury of right foot, initial encounter: Secondary | ICD-10-CM | POA: Diagnosis present

## 2021-07-12 MED ORDER — IBUPROFEN 600 MG PO TABS: 600.0000 mg | ORAL_TABLET | Freq: Three times a day (TID) | ORAL | 0 refills | Status: DC

## 2021-07-12 MED ORDER — IBUPROFEN 400 MG PO TABS
600.0000 mg | ORAL_TABLET | Freq: Once | ORAL | Status: AC
  Administered 2021-07-12: 600 mg via ORAL
  Filled 2021-07-12: qty 1

## 2021-07-12 MED ORDER — HYDROCODONE-ACETAMINOPHEN 5-325 MG PO TABS: 1.0000 | ORAL_TABLET | ORAL | 0 refills | Status: DC | PRN

## 2021-07-12 NOTE — ED Provider Notes (Signed)
[facility] EMERGENCY DEPT Provider Note   CSN: [id number] Arrival date & time: 07/12/21  1639     History Chief Complaint  Patient presents with  . Foot Injury    [name] is a 30 y.o. female.  Pt presents to the ED today with right foot pain.  Pt said she missed a step and fell.  She hurts on the bottom of her right foot.  She is able to walk, but it hurts.  No other injuries.      Past Medical History:  Diagnosis Date  . Anemia   . Anxiety   . Costochondritis   . HSV-2 infection   . Vaginal Pap smear, abnormal     Patient Active Problem List   Diagnosis Date Noted  . Acute kidney injury (HCC) 06/26/2020  . Arrest of descent, delivered, current hospitalization 06/24/2020  . Cesarean delivery delivered 06/24/2020  . Indication for care in labor and delivery, antepartum 06/23/2020  . Vasovagal syncope 05/07/2020  . Supervision of normal first pregnancy, antepartum 11/19/2019  . 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 CESAREAN SECTION N/A 06/24/2020   Procedure: CESAREAN SECTION;  Surgeon: Lazaro Arms, MD;  Location: [facility] LD ORS;  Service: Obstetrics;  Laterality: N/A;  . CESAREAN SECTION    . COLPOSCOPY     Dr. Dareen Piano at [facility] 1.5-2 years ago     OB History     Gravida  1   Para  1   Term  1   Preterm      AB      Living  1      SAB      IAB      Ectopic      Multiple  0   Live Births  1           Family History  Problem Relation Age of Onset  . Ovarian cancer Paternal Aunt   . Diabetes Maternal Grandmother     Social History   Tobacco Use  . Smoking status: Never  . Smokeless tobacco: Never  Vaping Use  . Vaping Use: Never used  Substance Use Topics  . Alcohol use: Not Currently  . Drug use: No    Home Medications Prior to Admission medications   Medication Sig Start Date End Date Taking? Authorizing Provider  HYDROcodone-acetaminophen  (NORCO/VICODIN) 5-325 MG tablet Take 1 tablet by mouth every 4 (four) hours as needed. 07/12/21  Yes Jacalyn Lefevre, MD  ibuprofen (ADVIL) 600 MG tablet Take 1 tablet (600 mg total) by mouth 3 (three) times daily. 07/12/21  Yes Jacalyn Lefevre, MD  acetaminophen (TYLENOL) 500 MG tablet Take 2 tablets (1,000 mg total) by mouth every 6 (six) hours. Patient not taking: No sig reported 06/26/20   Calvert Cantor, CNM  amoxicillin-clavulanate (AUGMENTIN) 875-125 MG tablet Take 1 tablet by mouth 2 (two) times daily. One po bid x 7 days Patient not taking: Reported on 04/14/2021 02/20/21   Dartha Lodge, PA-C  Blood Pressure Monitoring (BLOOD PRESSURE MONITOR AUTOMAT) DEVI 1 Device by Does not apply route daily. Automatic blood pressure cuff regular size. To monitor blood pressure regularly at home. ICD-10 code: O31.90 Patient not taking: No sig reported 11/29/19   Reva Bores, MD  ferrous sulfate 325 (65 FE) MG tablet Take 1 tablet (325 mg total) by mouth every other day. Patient not taking: Reported on 05/30/2020 03/25/20 06/23/20  Gerrit Heck, CNM  fluconazole (DIFLUCAN) 150 MG tablet Take 1 tablet (150 mg total) by mouth every 3 (three) days. Patient not taking: Reported on 04/14/2021 01/30/21   Raelyn Mora, CNM  medroxyPROGESTERone (DEPO-PROVERA) 150 MG/ML injection Inject 1 mL (150 mg total) into the muscle every 3 (three) months. 01/22/21   Raelyn Mora, CNM  Misc. Devices (BREAST PUMP) MISC 1 Device by Does not apply route daily. Electric Breast pump [id number] Diagnosis code: Z39.1 Patient not taking: Reported on 04/14/2021 02/13/21   Raelyn Mora, CNM  Misc. Devices (GOJJI WEIGHT SCALE) MISC 1 Device by Does not apply route daily as needed. To weight self daily as needed at home. ICD-10 code: O65.90 Patient not taking: No sig reported 11/29/19   Reva Bores, MD  Prenatal Vit-Fe Fumarate-FA (MULTIVITAMIN-PRENATAL) 27-0.8 MG TABS tablet Take 1 tablet by mouth daily at 12 noon. Patient not  taking: No sig reported    [provider]  valACYclovir (VALTREX) 500 MG tablet Take 1 tablet (500 mg total) by mouth 2 (two) times daily. Patient not taking: Reported on 04/14/2021 05/30/20   Gerrit Heck, CNM    Allergies    Patient has no known allergies.  Review of Systems   Review of Systems  Musculoskeletal:        Right foot pain  All other systems reviewed and are negative.  Physical Exam Updated Vital Signs BP 117/78 (BP Location: Left Arm)   Pulse 67   Temp 100 F (37.8 C) (Oral)   Resp 16   Ht 5\' 1"  (1.549 m)   Wt 52.2 kg   SpO2 100%   BMI 21.73 kg/m   Physical Exam Vitals and nursing note reviewed.  Constitutional:      Appearance: Normal appearance.  HENT:     Head: Normocephalic and atraumatic.     Right Ear: External ear normal.     Left Ear: External ear normal.     Nose: Nose normal.     Mouth/Throat:     Mouth: Mucous membranes are moist.     Pharynx: Oropharynx is clear.  Eyes:     Extraocular Movements: Extraocular movements intact.     Conjunctiva/sclera: Conjunctivae normal.     Pupils: Pupils are equal, round, and reactive to light.  Cardiovascular:     Rate and Rhythm: Normal rate and regular rhythm.     Pulses: Normal pulses.     Heart sounds: Normal heart sounds.  Pulmonary:     Effort: Pulmonary effort is normal.     Breath sounds: Normal breath sounds.  Abdominal:     General: Abdomen is flat. Bowel sounds are normal.     Palpations: Abdomen is soft.  Musculoskeletal:     Cervical back: Normal range of motion and neck supple.       Feet:  Skin:    General: Skin is warm.     Capillary Refill: Capillary refill takes less than 2 seconds.  Neurological:     General: No focal deficit present.     Mental Status: She is alert and oriented to person, place, and time.  Psychiatric:        Mood and Affect: Mood normal.        Behavior: Behavior normal.    ED Results / Procedures / Treatments   Labs (all labs ordered are  listed, but only abnormal results are displayed) Labs Reviewed - No data to display  EKG None  Radiology DG Foot Complete Right  Result Date: 07/12/2021 CLINICAL DATA:  Right foot injury, pain EXAM: RIGHT FOOT COMPLETE - 3+ VIEW COMPARISON:  None. FINDINGS: There is no evidence of fracture or dislocation. There is no evidence of arthropathy or other focal bone abnormality. Soft tissues are unremarkable. IMPRESSION: Negative. Electronically Signed   By: Helyn Numbers M.D.   On: 07/12/2021 19:16    Procedures Procedures   Medications Ordered in ED Medications  ibuprofen (ADVIL) tablet 600 mg (600 mg Oral Given 07/12/21 1946)    ED Course  I have reviewed the triage vital signs and the nursing notes.  Pertinent labs & imaging results that were available during my care of the patient were reviewed by me and considered in my medical decision making (see chart for details).    MDM Rules/Calculators/A&P                           Xray neg for fx.  Pt has pain along the plantar surface of her foot.  She is placed in a post op shoe and instructed to f/u with podiatry.  She said she did not need crutches.  She is to return if worse. Final Clinical Impression(s) / ED Diagnoses Final diagnoses:  Sprain of right foot, initial encounter    Rx / DC Orders ED Discharge Orders          Ordered    ibuprofen (ADVIL) 600 MG tablet  3 times daily        07/12/21 1938    HYDROcodone-acetaminophen (NORCO/VICODIN) 5-325 MG tablet  Every 4 hours PRN        07/12/21 1938             Jacalyn Lefevre, MD 07/12/21 2041

## 2021-07-12 NOTE — ED Triage Notes (Signed)
Pt missed a step and fell to floor, injury to bottom of rt foot. Pt is able to place weight on it.

## 2021-07-13 ENCOUNTER — Ambulatory Visit (INDEPENDENT_AMBULATORY_CARE_PROVIDER_SITE_OTHER): Payer: Medicaid Other | Admitting: *Deleted

## 2021-07-13 VITALS — BP 126/90 | HR 85 | Temp 98.5°F | Ht 62.0 in | Wt 110.2 lb

## 2021-07-13 DIAGNOSIS — Z3042 Encounter for surveillance of injectable contraceptive: Secondary | ICD-10-CM

## 2021-07-13 MED ORDER — VALACYCLOVIR HCL 500 MG PO TABS
500.0000 mg | ORAL_TABLET | Freq: Two times a day (BID) | ORAL | 1 refills | Status: DC
Start: 2021-07-13 — End: 2021-08-05

## 2021-07-13 NOTE — Progress Notes (Signed)
   Subjective:  Pt in for Depo Provera injection.    Objective: Need for contraception. No unusual complaints.    Assessment: Pt tolerated Depo injection. Depo given Left upper outer quadrant.   Plan:  Next injection due 09/28/21-10/12/21.   Rx for Valtrex sent to pharmacy.  Clovis Pu, RN

## 2021-08-05 ENCOUNTER — Other Ambulatory Visit: Payer: Self-pay | Admitting: Obstetrics and Gynecology

## 2021-08-21 ENCOUNTER — Other Ambulatory Visit: Payer: Self-pay | Admitting: Obstetrics and Gynecology

## 2021-09-09 NOTE — Progress Notes (Signed)
   Pt called to state her period was slightly heavier.  Stated it was like a normal period but not her typical period. She stated she is not going through multiple pads.  I explained to pt that this is normal especially since she has not had a true period in months. I explained to pt that if she is going through a pad an hour or if she is constantly bleeding to give [place] a call back she verbally understood   Gearldine Shown, CMA

## 2021-09-29 ENCOUNTER — Ambulatory Visit: Payer: Medicaid Other

## 2021-09-29 ENCOUNTER — Other Ambulatory Visit: Payer: Self-pay | Admitting: *Deleted

## 2021-09-29 DIAGNOSIS — Z3042 Encounter for surveillance of injectable contraceptive: Secondary | ICD-10-CM

## 2021-09-29 MED ORDER — MEDROXYPROGESTERONE ACETATE 150 MG/ML IM SUSP: 150.0000 mg | INTRAMUSCULAR | 3 refills | Status: AC

## 2021-09-30 ENCOUNTER — Ambulatory Visit: Payer: Medicaid Other

## 2021-10-27 ENCOUNTER — Encounter: Payer: Self-pay | Admitting: Obstetrics and Gynecology

## 2022-07-01 ENCOUNTER — Other Ambulatory Visit: Payer: Self-pay | Admitting: Obstetrics and Gynecology

## 2022-07-01 DIAGNOSIS — B3731 Acute candidiasis of vulva and vagina: Secondary | ICD-10-CM

## 2022-07-30 IMAGING — DX DG CHEST 1V PORT
1 series · 1 of 1 positions shown · non-contrast
Comparison: Radiograph 09/27/2013

CLINICAL DATA: Chest pain

EXAM:
PORTABLE CHEST 1 VIEW

[chest ap]
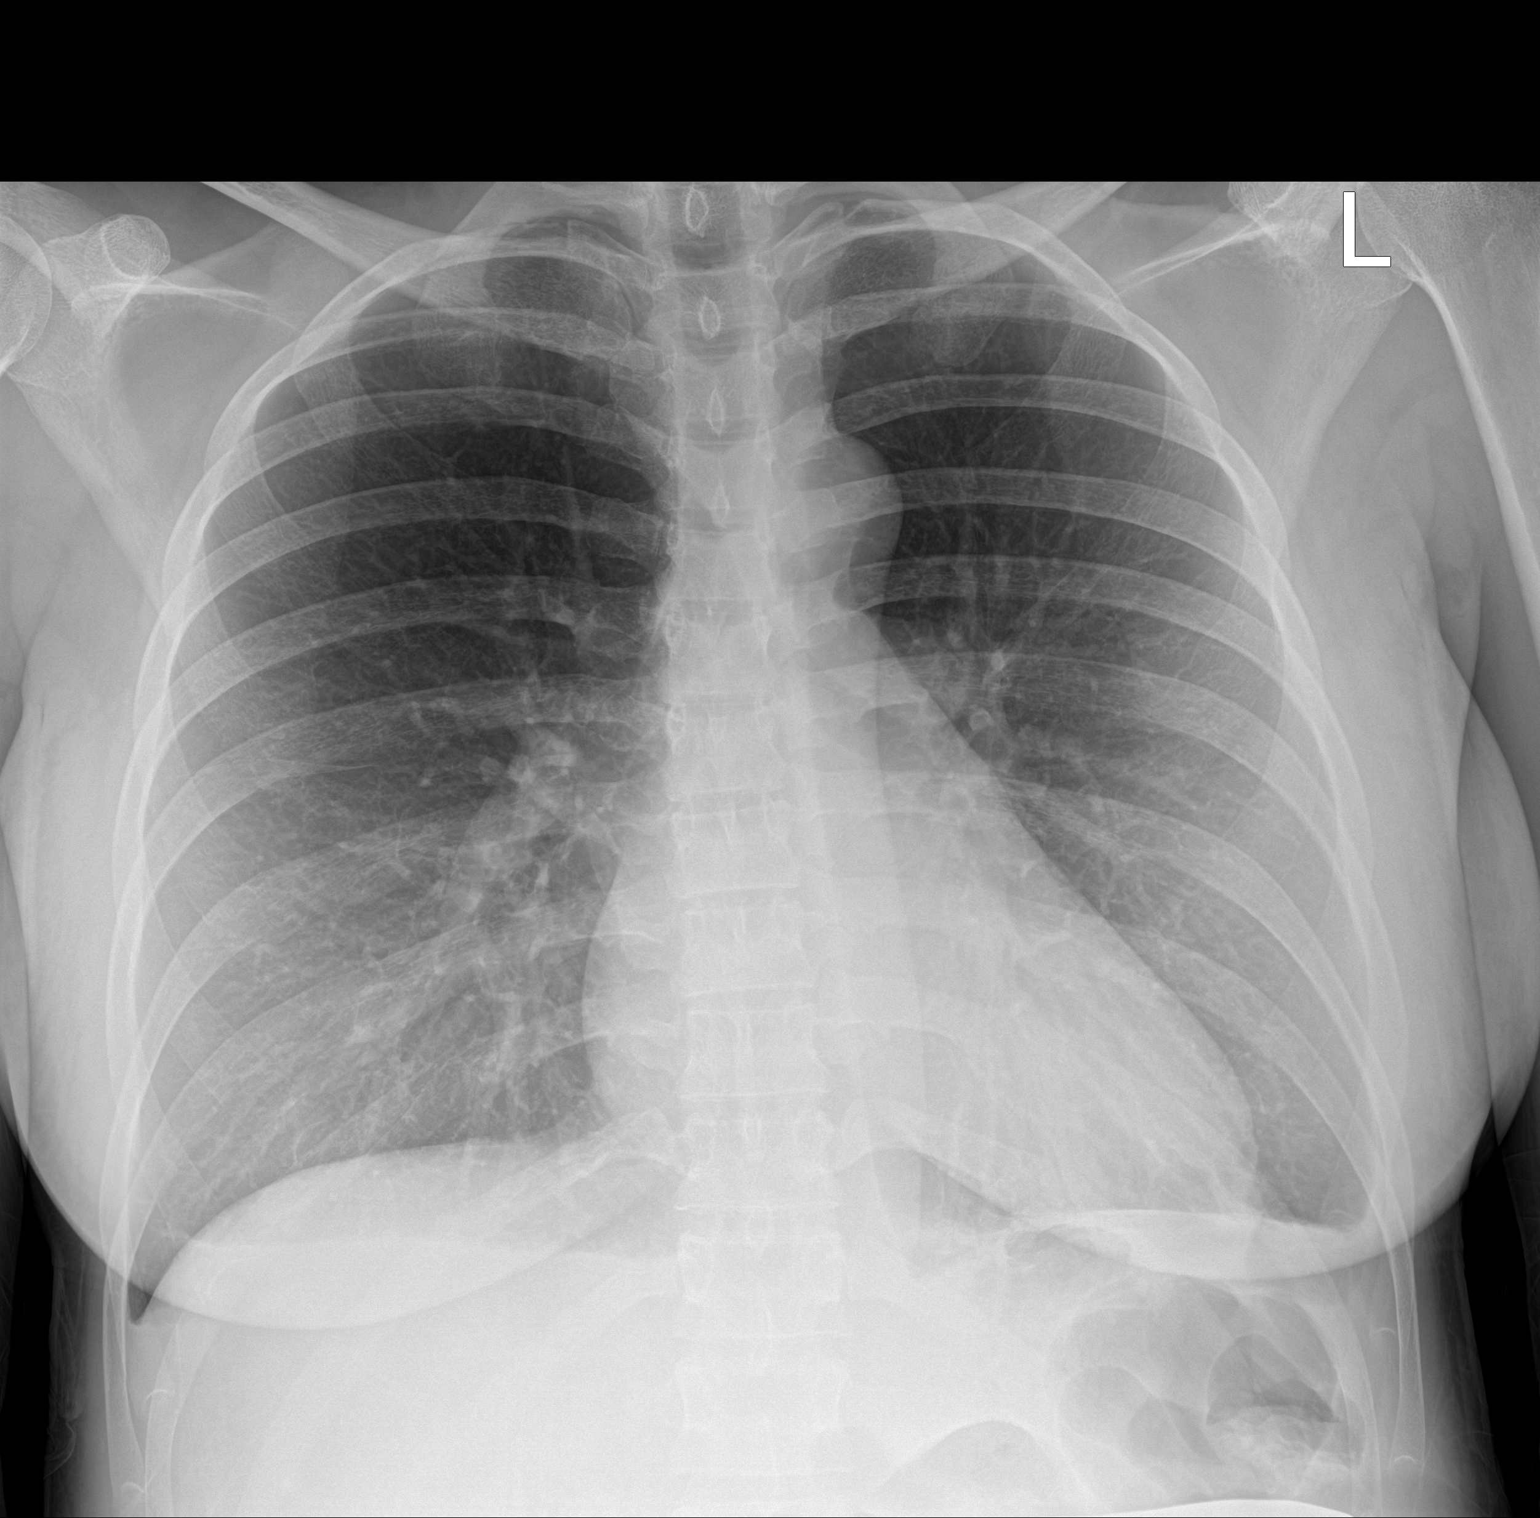

[1 of 1 positions shown; findings below may reference images not displayed]

FINDINGS: No consolidation, features of edema, pneumothorax, or effusion.
Pulmonary vascularity is normally distributed. The cardiomediastinal
contours are unremarkable. No acute osseous or soft tissue
abnormality.
IMPRESSION: No acute cardiopulmonary abnormality.

## 2022-08-16 ENCOUNTER — Emergency Department (HOSPITAL_BASED_OUTPATIENT_CLINIC_OR_DEPARTMENT_OTHER)
Admission: EM | Admit: 2022-08-16 | Discharge: 2022-08-17 | Disposition: A | Discharge status: Home / Self Care | Payer: Medicaid Other | Attending: Emergency Medicine | Admitting: Emergency Medicine

## 2022-08-16 ENCOUNTER — Other Ambulatory Visit: Payer: Self-pay

## 2022-08-16 ENCOUNTER — Encounter (HOSPITAL_BASED_OUTPATIENT_CLINIC_OR_DEPARTMENT_OTHER): Payer: Self-pay

## 2022-08-16 DIAGNOSIS — K529 Noninfective gastroenteritis and colitis, unspecified: Secondary | ICD-10-CM | POA: Insufficient documentation

## 2022-08-16 DIAGNOSIS — Z3400 Encounter for supervision of normal first pregnancy, unspecified trimester: Secondary | ICD-10-CM

## 2022-08-16 DIAGNOSIS — D72829 Elevated white blood cell count, unspecified: Secondary | ICD-10-CM | POA: Diagnosis not present

## 2022-08-16 DIAGNOSIS — R197 Diarrhea, unspecified: Secondary | ICD-10-CM | POA: Diagnosis present

## 2022-08-16 LAB — CBC
HCT: 40.8 % (ref 36.0–46.0)
Hemoglobin: 13.8 g/dL (ref 12.0–15.0)
MCH: 30.7 pg (ref 26.0–34.0)
MCHC: 33.8 g/dL (ref 30.0–36.0)
MCV: 90.7 fL (ref 80.0–100.0)
Platelets: 377 10*3/uL (ref 150–400)
RBC: 4.5 MIL/uL (ref 3.87–5.11)
RDW: 13.2 % (ref 11.5–15.5)
WBC: 11.4 10*3/uL — ABNORMAL HIGH (ref 4.0–10.5)
nRBC: 0 % (ref 0.0–0.2)

## 2022-08-16 LAB — COMPREHENSIVE METABOLIC PANEL
ALT: 10 U/L (ref 0–44)
AST: 16 U/L (ref 15–41)
Albumin: 4.9 g/dL (ref 3.5–5.0)
Alkaline Phosphatase: 45 U/L (ref 38–126)
Anion gap: 15 (ref 5–15)
BUN: 14 mg/dL (ref 6–20)
CO2: 23 mmol/L (ref 22–32)
Calcium: 9.6 mg/dL (ref 8.9–10.3)
Chloride: 106 mmol/L (ref 98–111)
Creatinine, Ser: 0.73 mg/dL (ref 0.44–1.00)
GFR, Estimated: >60 mL/min (ref >60)
Glucose, Bld: 97 mg/dL (ref 70–99)
Potassium: 3.9 mmol/L (ref 3.5–5.1)
Sodium: 144 mmol/L (ref 135–145)
Total Bilirubin: 0.7 mg/dL (ref 0.3–1.2)
Total Protein: 8.2 g/dL — ABNORMAL HIGH (ref 6.5–8.1)

## 2022-08-16 LAB — LIPASE, BLOOD: Lipase: 46 U/L (ref 11–51)

## 2022-08-16 NOTE — ED Triage Notes (Signed)
Pt states that she thinks that she drank too much last night and has been unable to keep anything down today. Pt also endorses diarrhea.

## 2022-08-17 LAB — URINALYSIS, ROUTINE W REFLEX MICROSCOPIC
Bilirubin Urine: NEGATIVE
Glucose, UA: NEGATIVE mg/dL
Hgb urine dipstick: NEGATIVE
Ketones, ur: 15 mg/dL — AB
Leukocytes,Ua: NEGATIVE
Nitrite: NEGATIVE
Specific Gravity, Urine: 1.031 — ABNORMAL HIGH (ref 1.005–1.030)
pH: 7 (ref 5.0–8.0)

## 2022-08-17 LAB — PREGNANCY, URINE: Preg Test, Ur: NEGATIVE

## 2022-08-17 MED ORDER — ONDANSETRON 4 MG PO TBDP: 4.0000 mg | ORAL_TABLET | Freq: Three times a day (TID) | ORAL | 0 refills | Status: DC | PRN

## 2022-08-17 MED ORDER — ONDANSETRON HCL 4 MG/2ML IJ SOLN
4.0000 mg | Freq: Once | INTRAMUSCULAR | Status: AC
  Administered 2022-08-17: 4 mg via INTRAVENOUS
  Filled 2022-08-17: qty 2

## 2022-08-17 MED ORDER — SODIUM CHLORIDE 0.9 % IV BOLUS
1000.0000 mL | Freq: Once | INTRAVENOUS | Status: AC
  Administered 2022-08-17: 1000 mL via INTRAVENOUS

## 2022-08-17 NOTE — ED Provider Notes (Signed)
[facility] EMERGENCY DEPT  Provider Note  CSN: [id number] Arrival date & time: 08/16/22 1939  History Chief Complaint  Patient presents with   Emesis   Diarrhea    [name] is a 31 y.o. female reports she has had N/V/D all day long, unable to keep anything down. No fever or pain. She thinks she might have had to much alcohol yesterday. She denies any blood. She reports she has had 'stomach issues' but is unable to further quantify what her problem has been and has not been to see a doctor about it.    Home Medications Prior to Admission medications   Medication Sig Start Date End Date Taking? Authorizing Provider  ondansetron (ZOFRAN-ODT) 4 MG disintegrating tablet Take 1 tablet (4 mg total) by mouth every 8 (eight) hours as needed for nausea or vomiting. 08/17/22  Yes Pollyann Savoy, MD  medroxyPROGESTERone (DEPO-PROVERA) 150 MG/ML injection Inject 1 mL (150 mg total) into the muscle every 3 (three) months. 09/29/21   Raelyn Mora, CNM  valACYclovir (VALTREX) 500 MG tablet TAKE 1 TABLET BY MOUTH TWICE A DAY 08/21/21   Raelyn Mora, CNM     Allergies    Patient has no known allergies.   Review of Systems   Review of Systems Please see HPI for pertinent positives and negatives  Physical Exam BP 110/76 (BP Location: Right Arm)   Pulse (!) 58   Temp 98.1 F (36.7 C) (Oral)   Resp 14   Ht 5\' 1"  (1.549 m)   Wt 52.2 kg   LMP 07/12/2022   SpO2 100%   BMI 21.73 kg/m   Physical Exam Vitals and nursing note reviewed.  Constitutional:      Appearance: Normal appearance.  HENT:     Head: Normocephalic and atraumatic.     Nose: Nose normal.     Mouth/Throat:     Mouth: Mucous membranes are dry.  Eyes:     Extraocular Movements: Extraocular movements intact.     Conjunctiva/sclera: Conjunctivae normal.  Cardiovascular:     Rate and Rhythm: Normal rate.  Pulmonary:     Effort: Pulmonary effort is normal.     Breath sounds: Normal breath  sounds.  Abdominal:     General: Abdomen is flat.     Palpations: Abdomen is soft.     Tenderness: There is no abdominal tenderness. There is no guarding.  Musculoskeletal:        General: No swelling. Normal range of motion.     Cervical back: Neck supple.  Skin:    General: Skin is warm and dry.  Neurological:     General: No focal deficit present.     Mental Status: She is alert.  Psychiatric:        Mood and Affect: Mood normal.     ED Results / Procedures / Treatments   EKG None  Procedures Procedures  Medications Ordered in the ED Medications  sodium chloride 0.9 % bolus 1,000 mL (0 mLs Intravenous Stopped 08/17/22 0224)  ondansetron (ZOFRAN) injection 4 mg (4 mg Intravenous Given 08/17/22 0120)    Initial Impression and Plan  Patient here with N/V/D all day long, could be from [facility] use vs viral GI infection. Labs done in triage show CBC with mild leukocytosis, CMP and lipase are unremarkable. UA and HCG are neg. Will give Zofran, IVF and reassess.   ED Course   Clinical Course as of 08/17/22 0231  Tue Aug 17, 2022  0229 Patient reports  she is feeling better, tolerating PO fluids and ready to go home. Rx for Zofran, advance diet as tolerated, ETOH in moderation. RTED for any other concerns.  [CS]    Clinical Course User Index [CS] Pollyann Savoy, MD     MDM Rules/Calculators/A&P Medical Decision Making Problems Addressed: Gastroenteritis: acute illness or injury  Amount and/or Complexity of Data Reviewed Labs: ordered. Decision-making details documented in ED Course.  Risk Prescription drug management.    Final Clinical Impression(s) / ED Diagnoses Final diagnoses:  Gastroenteritis    Rx / DC Orders ED Discharge Orders          Ordered    ondansetron (ZOFRAN-ODT) 4 MG disintegrating tablet  Every 8 hours PRN        08/17/22 0231             Pollyann Savoy, MD 08/17/22 [id number]

## 2022-11-28 ENCOUNTER — Encounter (HOSPITAL_COMMUNITY): Payer: Self-pay | Admitting: *Deleted

## 2022-11-28 ENCOUNTER — Ambulatory Visit (HOSPITAL_COMMUNITY)
Admission: EM | Admit: 2022-11-28 | Discharge: 2022-11-28 | Disposition: A | Discharge status: Home / Self Care | Payer: Medicaid Other | Attending: Physician Assistant | Admitting: Physician Assistant

## 2022-11-28 ENCOUNTER — Other Ambulatory Visit: Payer: Self-pay

## 2022-11-28 DIAGNOSIS — J111 Influenza due to unidentified influenza virus with other respiratory manifestations: Secondary | ICD-10-CM | POA: Diagnosis present

## 2022-11-28 DIAGNOSIS — Z20828 Contact with and (suspected) exposure to other viral communicable diseases: Secondary | ICD-10-CM | POA: Diagnosis present

## 2022-11-28 DIAGNOSIS — J069 Acute upper respiratory infection, unspecified: Secondary | ICD-10-CM | POA: Diagnosis present

## 2022-11-28 DIAGNOSIS — Z1152 Encounter for screening for COVID-19: Secondary | ICD-10-CM | POA: Insufficient documentation

## 2022-11-28 MED ORDER — OSELTAMIVIR PHOSPHATE 75 MG PO CAPS: 75.0000 mg | ORAL_CAPSULE | Freq: Two times a day (BID) | ORAL | 0 refills | Status: AC

## 2022-11-28 MED ORDER — PROMETHAZINE-DM 6.25-15 MG/5ML PO SYRP: 5.0000 mL | ORAL_SOLUTION | Freq: Three times a day (TID) | ORAL | 0 refills | Status: AC | PRN

## 2022-11-28 MED ORDER — ONDANSETRON 4 MG PO TBDP: 4.0000 mg | ORAL_TABLET | Freq: Three times a day (TID) | ORAL | 0 refills | Status: DC | PRN

## 2022-11-28 NOTE — Discharge Instructions (Signed)
Unfortunately, we cannot test for flu but I suspect that you have flu given your exposure and your symptoms.  Start Tamiflu twice daily for 5 days.  We are testing you for COVID.  Monitor your MyChart for these results; we will call you if it is positive.  Use Zofran every 8 hours for nausea and vomiting symptoms.  Drink plenty of fluid and eat small frequent meals.  Use Promethazine DM for cough.  This make you sleepy so do not drive or drink alcohol with taking it.  Make sure that you rest and drink plenty of fluid.  Follow-up with your primary care or clinic if you are not better in a week.  If anything worsens you should be seen immediately.

## 2022-11-28 NOTE — ED Provider Notes (Signed)
[place]    CSN: [id number] Arrival date & time: 11/28/22  1034      History   Chief Complaint Chief Complaint  Patient presents with   Influenza    Entered by patient   Emesis   Fever   Generalized Body Aches    HPI [name] is a 32 y.o. female.   Patient presents today with a 2-day history of URI symptoms.  Reports body aches, subjective fever, chills, fatigue, cough, congestion, nausea.  Denies any vomiting, chest pain, shortness of breath, diarrhea.  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  She has had her influenza vaccine but has not had COVID vaccinations.  She has had COVID in the past with last episode several years ago.  She has tried over-the-counter medications with minimal improvement of symptoms.  Denies any recent antibiotics or steroids.  She denies any significant past medical history including allergies, asthma, COPD, smoking.  She has been able to eat and drink despite symptoms.    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 Ouida Sills at [facility] 1.5-2 years ago    OB History     Gravida  1   Para  1   Term  1   Preterm      AB      Living  1       SAB      IAB      Ectopic      Multiple  0   Live Births  1            Home Medications    Prior to Admission medications   Medication Sig Start Date End Date Taking? Authorizing Provider  oseltamivir (TAMIFLU) 75 MG capsule Take 1 capsule (75 mg total) by mouth every 12 (twelve) hours. 11/28/22  Yes France Lusty K, PA-C  promethazine-dextromethorphan (PROMETHAZINE-DM) 6.25-15 MG/5ML syrup Take 5 mLs by mouth 3 (three) times daily as needed for cough. 11/28/22  Yes Makaleigh Reinard, Derry Skill, PA-C  medroxyPROGESTERone (DEPO-PROVERA) 150 MG/ML injection Inject 1 mL (150 mg total) into the muscle every 3 (three) months. 09/29/21   Laury Deep, CNM  ondansetron (ZOFRAN-ODT) 4 MG disintegrating tablet Take 1 tablet (4 mg total) by mouth every 8 (eight) hours as needed for nausea or vomiting. 11/28/22   Shawntia Mangal, Derry Skill, PA-C  valACYclovir (VALTREX) 500 MG tablet TAKE 1 TABLET BY MOUTH TWICE A DAY 08/21/21   Laury Deep, CNM    Family History Family History  Problem Relation Age of Onset   Ovarian cancer Paternal Aunt    Diabetes Maternal Grandmother     Social History Social  History   Tobacco Use   Smoking status: Never   Smokeless tobacco: Never  Vaping Use   Vaping Use: Never used  Substance Use Topics   Alcohol use: Yes    Comment: socially   Drug use: No     Allergies   Patient has no known allergies.   Review of Systems Review of Systems  Constitutional:  Positive for activity change, appetite change, chills, fatigue and fever.  HENT:  Positive for congestion. Negative for sinus pressure, sneezing and sore throat.   Respiratory:  Positive for cough. Negative for shortness of breath.   Cardiovascular:  Negative for chest pain.  Gastrointestinal:  Positive for nausea. Negative for abdominal pain, diarrhea and vomiting.  Musculoskeletal:  Positive for arthralgias and myalgias.  Neurological:  Positive for headaches. Negative for dizziness and light-headedness.      Physical Exam Triage Vital Signs ED Triage Vitals  Enc Vitals Group     BP 11/28/22 1108 115/82     Pulse Rate 11/28/22 1108 98     Resp 11/28/22 1108 18     Temp 11/28/22 1108 98.9 F (37.2 C)     Temp src --      SpO2 11/28/22 1108 98 %     Weight --      Height --      Head Circumference --      Peak Flow --      Pain Score 11/28/22 1106 6     Pain Loc --      Pain Edu? --      Excl. in [place]? --    No data found.  Updated Vital Signs BP 115/82   Pulse 98   Temp 98.9 F (37.2 C)   Resp 18   SpO2 98%   Visual Acuity Right Eye Distance:   Left Eye Distance:   Bilateral Distance:    Right Eye Near:   Left Eye Near:    Bilateral Near:     Physical Exam Vitals reviewed.  Constitutional:      General: She is awake. She is not in acute distress.    Appearance: Normal appearance. She is well-developed. She is not ill-appearing.     Comments: Very pleasant female appears stated age in no acute distress sitting comfortably in exam room  HENT:     Head: Normocephalic and atraumatic.     Right Ear: Tympanic membrane, ear canal and external ear normal. Tympanic membrane is not erythematous or bulging.     Left Ear: Tympanic membrane, ear canal and external ear normal. Tympanic membrane is not erythematous or bulging.     Nose:     Right Sinus: No maxillary sinus tenderness or frontal sinus tenderness.     Left Sinus: No maxillary sinus tenderness or frontal sinus tenderness.     Mouth/Throat:     Pharynx: Uvula midline. Posterior oropharyngeal erythema present. No oropharyngeal exudate.  Cardiovascular:     Rate and Rhythm: Normal rate and regular rhythm.     Heart sounds: Normal heart sounds, S1 normal and S2 normal. No murmur heard. Pulmonary:     Effort: Pulmonary effort is normal.     Breath sounds: Normal breath sounds. No wheezing, rhonchi or rales.     Comments: Clear to auscultation bilaterally Psychiatric:        Behavior: Behavior is cooperative.       UC Treatments / Results  Labs (all labs ordered are listed, but only abnormal results are displayed) Labs  Reviewed  SARS CORONAVIRUS 2 (TAT 6-24 HRS)    EKG   Radiology No results found.  Procedures Procedures (including critical care time)  Medications Ordered in UC Medications - No data to display  Initial Impression / Assessment and Plan / UC Course  I have reviewed the triage vital signs and the nursing notes.  Pertinent labs & imaging results that were available during my care of the patient were reviewed by me and considered in my medical decision making (see chart for details).     Patient is well-appearing, afebrile, nontoxic, nontachycardic.  Concern for influenza given known exposure and clinical presentation.  No evidence of acute infection on physical exam that warrant initiation of antibiotics.  Unfortunately, we do not have influenza testing capabilities at this time due to national shortage of reagents.  We will test for COVID and discussed that if she is positive for COVID she should discontinue the Tamiflu.  Since she is within 48 hours of symptom onset we will start Tamiflu 75 mg twice daily for 5 days.  Will also treat symptomatically with Promethazine DM; discussed that this can be sedating and she is not to drink or drive while taking this medication.  Given her nausea symptoms refill of Zofran was sent to pharmacy with instruction to use this on a scheduled basis for the next several days.  Recommend that she push fluids and eat small frequent meals.  If her symptoms are not improving within a week she is to return for reevaluation.  Discussed that if she has any worsening symptoms including high fever, chest pain, shortness of breath, nausea/vomiting interfering with oral intake, weakness that she should be seen immediately.  Strict return precautions given.  Work excuse note with current CDC return to work guidelines based on COVID test result provided  during visit today.  Final Clinical Impressions(s) / UC Diagnoses   Final diagnoses:  Influenza-like illness  Upper respiratory tract infection, unspecified type  Exposure to influenza     Discharge Instructions      Unfortunately, we cannot test for flu but I suspect that you have flu given your exposure and your symptoms.  Start Tamiflu twice daily for 5 days.  We are testing you for COVID.  Monitor your MyChart for these results; we will call you if it is positive.  Use Zofran every 8 hours for nausea and vomiting symptoms.  Drink plenty of fluid and eat small frequent meals.  Use Promethazine DM for cough.  This make you sleepy so do not drive or drink alcohol with taking it.  Make sure that you rest and drink plenty of fluid.  Follow-up with your primary care or clinic if you are not better in a week.  If anything worsens you should be seen immediately.     ED Prescriptions     Medication Sig Dispense Auth. Provider   ondansetron (ZOFRAN-ODT) 4 MG disintegrating tablet Take 1 tablet (4 mg total) by mouth every 8 (eight) hours as needed for nausea or vomiting. 20 tablet Kalinda Romaniello K, PA-C   promethazine-dextromethorphan (PROMETHAZINE-DM) 6.25-15 MG/5ML syrup Take 5 mLs by mouth 3 (three) times daily as needed for cough. 118 mL Dwana Garin K, PA-C   oseltamivir (TAMIFLU) 75 MG capsule Take 1 capsule (75 mg total) by mouth every 12 (twelve) hours. 10 capsule Sumiye Hirth, Derry Skill, PA-C      PDMP not reviewed this encounter.   Terrilee Croak, PA-C 11/28/22 1146

## 2022-11-28 NOTE — ED Triage Notes (Signed)
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 Pt has nausea,body aches and fever since Friday.

## 2022-11-29 LAB — SARS CORONAVIRUS 2 (TAT 6-24 HRS): SARS Coronavirus 2: NEGATIVE

## 2022-12-02 ENCOUNTER — Emergency Department (HOSPITAL_BASED_OUTPATIENT_CLINIC_OR_DEPARTMENT_OTHER)
Admission: EM | Admit: 2022-12-02 | Discharge: 2022-12-02 | Disposition: A | Discharge status: Home / Self Care | Payer: Medicaid Other | Attending: Emergency Medicine | Admitting: Emergency Medicine

## 2022-12-02 ENCOUNTER — Other Ambulatory Visit (HOSPITAL_BASED_OUTPATIENT_CLINIC_OR_DEPARTMENT_OTHER): Payer: Self-pay

## 2022-12-02 ENCOUNTER — Other Ambulatory Visit: Payer: Self-pay

## 2022-12-02 ENCOUNTER — Encounter (HOSPITAL_BASED_OUTPATIENT_CLINIC_OR_DEPARTMENT_OTHER): Payer: Self-pay | Admitting: Emergency Medicine

## 2022-12-02 ENCOUNTER — Emergency Department (HOSPITAL_BASED_OUTPATIENT_CLINIC_OR_DEPARTMENT_OTHER): Payer: Medicaid Other

## 2022-12-02 DIAGNOSIS — R531 Weakness: Secondary | ICD-10-CM | POA: Diagnosis present

## 2022-12-02 DIAGNOSIS — J111 Influenza due to unidentified influenza virus with other respiratory manifestations: Secondary | ICD-10-CM

## 2022-12-02 DIAGNOSIS — Z20822 Contact with and (suspected) exposure to covid-19: Secondary | ICD-10-CM | POA: Insufficient documentation

## 2022-12-02 DIAGNOSIS — J101 Influenza due to other identified influenza virus with other respiratory manifestations: Secondary | ICD-10-CM | POA: Insufficient documentation

## 2022-12-02 LAB — CBC WITH DIFFERENTIAL/PLATELET
Abs Immature Granulocytes: 0.02 10*3/uL (ref 0.00–0.07)
Basophils Absolute: 0 10*3/uL (ref 0.0–0.1)
Basophils Relative: 0 %
Eosinophils Absolute: 0 10*3/uL (ref 0.0–0.5)
Eosinophils Relative: 0 %
HCT: 39.4 % (ref 36.0–46.0)
Hemoglobin: 13.9 g/dL (ref 12.0–15.0)
Immature Granulocytes: 0 %
Lymphocytes Relative: 34 %
Lymphs Abs: 2.6 10*3/uL (ref 0.7–4.0)
MCH: 30.5 pg (ref 26.0–34.0)
MCHC: 35.3 g/dL (ref 30.0–36.0)
MCV: 86.4 fL (ref 80.0–100.0)
Monocytes Absolute: 0.5 10*3/uL (ref 0.1–1.0)
Monocytes Relative: 7 %
Neutro Abs: 4.5 10*3/uL (ref 1.7–7.7)
Neutrophils Relative %: 59 %
Platelets: 274 10*3/uL (ref 150–400)
RBC: 4.56 MIL/uL (ref 3.87–5.11)
RDW: 11.9 % (ref 11.5–15.5)
WBC: 7.8 10*3/uL (ref 4.0–10.5)
nRBC: 0 % (ref 0.0–0.2)

## 2022-12-02 LAB — RESP PANEL BY RT-PCR (RSV, FLU A&B, COVID)  RVPGX2
Influenza A by PCR: NEGATIVE
Influenza B by PCR: POSITIVE — AB
Resp Syncytial Virus by PCR: NEGATIVE
SARS Coronavirus 2 by RT PCR: NEGATIVE

## 2022-12-02 LAB — BASIC METABOLIC PANEL
Anion gap: 10 (ref 5–15)
BUN: 9 mg/dL (ref 6–20)
CO2: 25 mmol/L (ref 22–32)
Calcium: 9.3 mg/dL (ref 8.9–10.3)
Chloride: 99 mmol/L (ref 98–111)
Creatinine, Ser: 0.72 mg/dL (ref 0.44–1.00)
GFR, Estimated: >60 mL/min (ref >60)
Glucose, Bld: 89 mg/dL (ref 70–99)
Potassium: 3.7 mmol/L (ref 3.5–5.1)
Sodium: 134 mmol/L — ABNORMAL LOW (ref 135–145)

## 2022-12-02 MED ORDER — METOCLOPRAMIDE HCL 10 MG PO TABS: 10.0000 mg | ORAL_TABLET | Freq: Four times a day (QID) | ORAL | 0 refills | Status: AC | PRN

## 2022-12-02 MED ORDER — DEXAMETHASONE SODIUM PHOSPHATE 10 MG/ML IJ SOLN
10.0000 mg | Freq: Once | INTRAMUSCULAR | Status: AC
  Administered 2022-12-02: 10 mg via INTRAVENOUS
  Filled 2022-12-02: qty 1

## 2022-12-02 MED ORDER — METOCLOPRAMIDE HCL 5 MG/ML IJ SOLN
10.0000 mg | Freq: Once | INTRAMUSCULAR | Status: AC
  Administered 2022-12-02: 10 mg via INTRAVENOUS
  Filled 2022-12-02: qty 2

## 2022-12-02 MED ORDER — SODIUM CHLORIDE 0.9 % IV BOLUS
1000.0000 mL | Freq: Once | INTRAVENOUS | Status: AC
  Administered 2022-12-02: 1000 mL via INTRAVENOUS

## 2022-12-02 MED ORDER — KETOROLAC TROMETHAMINE 15 MG/ML IJ SOLN
15.0000 mg | Freq: Once | INTRAMUSCULAR | Status: AC
  Administered 2022-12-02: 15 mg via INTRAVENOUS
  Filled 2022-12-02: qty 1

## 2022-12-02 NOTE — ED Provider Notes (Signed)
[place] Provider Note  CSN: [id number] Arrival date & time: 12/02/22 1035  Chief Complaint(s) Weakness  HPI [name] is a 32 y.o. female presenting to the emergency department with weakness.  She reports associated cough, nausea and vomiting, sore throat, runny nose.  Symptoms been present for 6 days.  She reports she initially went to an urgent care and was told she may have the flu and prescribed Tamiflu and given Zofran but she has had persistent nausea and vomiting.  No diarrhea, bloody stools, hematemesis.  No abdominal pain.  She reports that she has started to have some productive cough which is new.  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 Authorizing Provider  medroxyPROGESTERone (DEPO-PROVERA) 150 MG/ML injection Inject 1 mL (150 mg total) into the muscle every 3 (three) months. 09/29/21   Laury Deep, CNM  ondansetron (ZOFRAN-ODT) 4 MG disintegrating tablet Take 1 tablet (4 mg total) by mouth every 8 (eight) hours as needed for nausea or vomiting. 11/28/22   Raspet, Derry Skill, PA-C  oseltamivir (TAMIFLU) 75 MG capsule Take 1 capsule (75 mg total) by mouth every 12 (twelve) hours. 11/28/22   Raspet, Derry Skill, PA-C  promethazine-dextromethorphan (PROMETHAZINE-DM) 6.25-15 MG/5ML  syrup Take 5 mLs by mouth 3 (three) times daily as needed for cough. 11/28/22   Raspet, Derry Skill, PA-C  valACYclovir (VALTREX) 500 MG tablet TAKE 1 TABLET BY MOUTH TWICE A DAY 08/21/21   Laury Deep, CNM                                                                                                                                    Past Surgical History Past Surgical History:  Procedure Laterality Date   CESAREAN SECTION N/A 06/24/2020   Procedure: CESAREAN SECTION;  Surgeon: Florian Buff, MD;  Location: [facility] LD ORS;  Service: Obstetrics;  Laterality: N/A;   CESAREAN SECTION     COLPOSCOPY     Dr. Ouida Sills at [facility] 1.5-2 years ago   Family History Family History  Problem Relation Age of Onset   Ovarian cancer Paternal Aunt    Diabetes Maternal Grandmother     Social History Social History   Tobacco Use   Smoking  status: Never   Smokeless tobacco: Never  Vaping Use   Vaping Use: Never used  Substance Use Topics   Alcohol use: Yes    Comment: socially   Drug use: No   Allergies Patient has no known allergies.  Review of Systems Review of Systems  All other systems reviewed and are negative.   Physical Exam Vital Signs  I have reviewed the triage vital signs BP 113/89   Pulse 78   Temp 98.1 F (36.7 C) (Oral)   Resp 16   Ht '5\' 1"'$  (1.549 m)   Wt 49.9 kg   SpO2 100%   BMI 20.78 kg/m  Physical Exam Vitals and nursing note reviewed.  Constitutional:      General: She is not in acute distress.    Appearance: She is well-developed.  HENT:     Head: Normocephalic and atraumatic.     Mouth/Throat:     Mouth: Mucous membranes are dry.  Eyes:     Pupils: Pupils are equal, round, and reactive to light.  Cardiovascular:     Rate and Rhythm: Normal rate and regular rhythm.     Heart sounds: No murmur heard. Pulmonary:     Effort: Pulmonary effort is normal. No respiratory distress.     Breath sounds: Normal breath sounds.  Abdominal:     General:  Abdomen is flat.     Palpations: Abdomen is soft.     Tenderness: There is no abdominal tenderness.  Musculoskeletal:        General: No tenderness.     Right lower leg: No edema.     Left lower leg: No edema.  Skin:    General: Skin is warm and dry.  Neurological:     General: No focal deficit present.     Mental Status: She is alert. Mental status is at baseline.  Psychiatric:        Mood and Affect: Mood normal.        Behavior: Behavior normal.     ED Results and Treatments Labs (all labs ordered are listed, but only abnormal results are displayed) Labs Reviewed  RESP PANEL BY RT-PCR (RSV, FLU A&B, COVID)  RVPGX2 - Abnormal; Notable for the following components:      Result Value   Influenza B by PCR POSITIVE (*)    All other components within normal limits  BASIC METABOLIC PANEL - Abnormal; Notable for the following components:   Sodium 134 (*)    All other components within normal limits  CBC WITH DIFFERENTIAL/PLATELET                                                                                                                          Radiology DG Chest Port 1 View  Result Date: 12/02/2022 CLINICAL DATA:  Cough.  Flu positive. EXAM: PORTABLE CHEST 1 VIEW COMPARISON:  06/28/2020 FINDINGS: The heart size and mediastinal contours are within normal limits. Both lungs are clear. The visualized skeletal  structures are unremarkable. IMPRESSION: No active disease. Electronically Signed   By: Misty Stanley M.D.   On: 12/02/2022 12:06    Pertinent labs & imaging results that were available during my care of the patient were reviewed by me and considered in my medical decision making (see MDM for details).  Medications Ordered in ED Medications  sodium chloride 0.9 % bolus 1,000 mL (1,000 mLs Intravenous New Bag/Given 12/02/22 1138)  metoCLOPramide (REGLAN) injection 10 mg (10 mg Intravenous Given 12/02/22 1140)  ketorolac (TORADOL) 15 MG/ML injection 15 mg (15 mg Intravenous  Given 12/02/22 1140)  dexamethasone (DECADRON) injection 10 mg (10 mg Intravenous Given 12/02/22 1139)                                                                                                                                     Procedures Procedures  (including critical care time)  Medical Decision Making / ED Course   MDM:  32 year old female presenting to the emergency department with weakness.  Patient overall well-appearing, physical exam reassuring.  Does appear minimally dehydrated but no tachycardia, hypotension.  Given persistent nausea and vomiting, will obtain basic labs, give IV fluids.  Will trial alternative antiemetic from [facility].  Patient also reports productive cough, although lungs were clear on exam will obtain chest x-ray to evaluate for developing pneumonia given persistence of symptoms.  Suspect overall cause of her symptoms is a viral syndrome.  Could be influenza or COVID or alternative upper respiratory tract infection.  Although she has had nausea and vomiting doubt intra-abdominal process, abdomen soft and nontender.  Patient has no abdominal pain to suggest process such as pancreatitis, gastritis, cholecystitis, obstruction.  Will reassess after treatment.  Anticipate likely discharge.  Clinical Course as of 12/02/22 1240  Thu Dec 02, 2022  1132 Influenza B By PCR(!): POSITIVE [WS]  1238 Patient feels better.  Labs reassuring.  Patient tolerating p.o.  Will prescribe Reglan. Flu is positive. Will discharge patient to home. All questions answered. Patient comfortable with plan of discharge. Return precautions discussed with patient and specified on the after visit summary. [WS]    Clinical Course User Index [WS] Cristie Hem, MD     Additional history obtained: -Additional history obtained from family -External records from outside source obtained and reviewed including: Chart review including previous notes, labs, imaging, consultation notes  including UC note 2/25   Lab Tests: -I ordered, reviewed, and interpreted labs.   The pertinent results include:   Labs Reviewed  RESP PANEL BY RT-PCR (RSV, FLU A&B, COVID)  RVPGX2 - Abnormal; Notable for the following components:      Result Value   Influenza B by PCR POSITIVE (*)    All other components within normal limits  BASIC METABOLIC PANEL - Abnormal; Notable for the following components:   Sodium 134 (*)    All other components within normal limits  CBC WITH DIFFERENTIAL/PLATELET  Notable for positive FLU   Imaging Studies ordered: I ordered imaging studies including CXR On my interpretation imaging demonstrates clear lungs I independently visualized and interpreted imaging. I agree with the radiologist interpretation   Medicines ordered and prescription drug management: Meds ordered this encounter  Medications   sodium chloride 0.9 % bolus 1,000 mL   metoCLOPramide (REGLAN) injection 10 mg   ketorolac (TORADOL) 15 MG/ML injection 15 mg   dexamethasone (DECADRON) injection 10 mg    -I have reviewed the patients home medicines and have made adjustments as needed  Cardiac Monitoring: The patient was maintained on a cardiac monitor.  I personally viewed and interpreted the cardiac monitored which showed an underlying rhythm of: NSR    Reevaluation: After the interventions noted above, I reevaluated the patient and found that their symptoms have improved  Co morbidities that complicate the patient evaluation  Past Medical History:  Diagnosis Date   Anemia    Anxiety    Costochondritis    HSV-2 infection    Vaginal Pap smear, abnormal       Dispostion: Disposition decision including need for hospitalization was considered, and patient discharged from emergency department.    Final Clinical Impression(s) / ED Diagnoses Final diagnoses:  Influenza     This chart was dictated using voice recognition software.  Despite best efforts to proofread,   errors can occur which can change the documentation meaning.    Cristie Hem, MD 12/02/22 1240

## 2022-12-02 NOTE — ED Triage Notes (Signed)
Pt arrives to ED with c/o weakness since x6 days. She notes she was told by another provider she has the flu however she was not swabbed. Associated symptoms include vomiting

## 2023-08-13 IMAGING — DX DG FOOT COMPLETE 3+V*R*
2 series · 3 of 3 positions shown · non-contrast
Comparison: None.

CLINICAL DATA: Right foot injury, pain

EXAM:
RIGHT FOOT COMPLETE - 3+ VIEW

[Series 1: foot · 0.14mm/px · 2 of 2 slices shown]
[im 1/2]
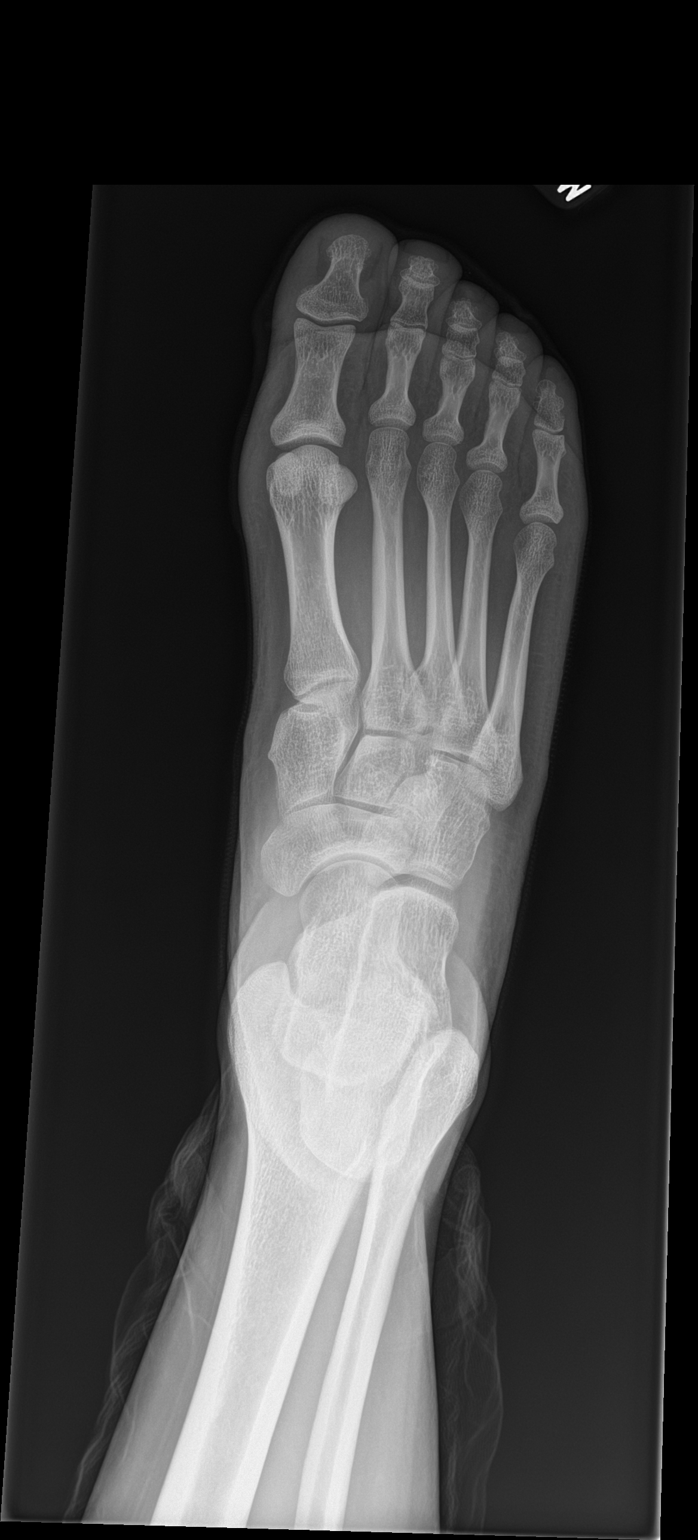
[im 2/2]
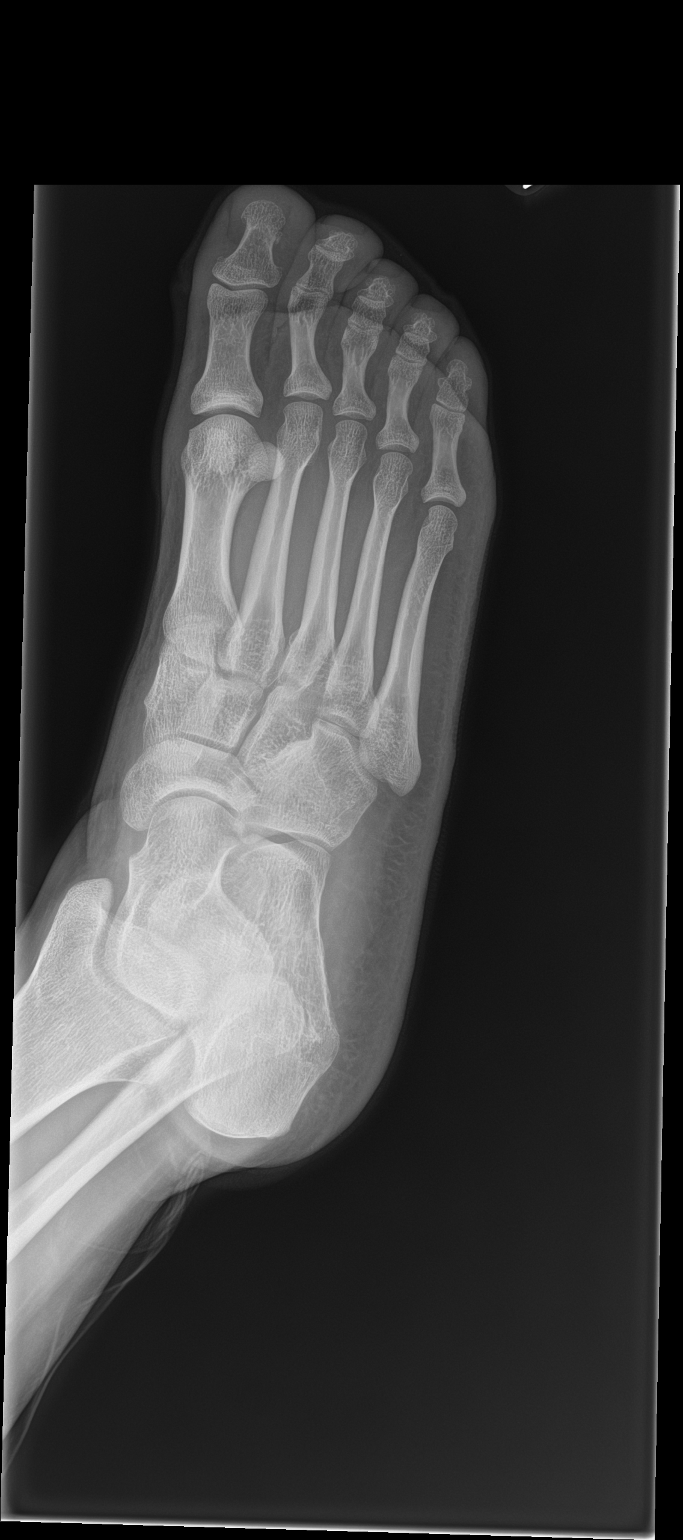

[leg]
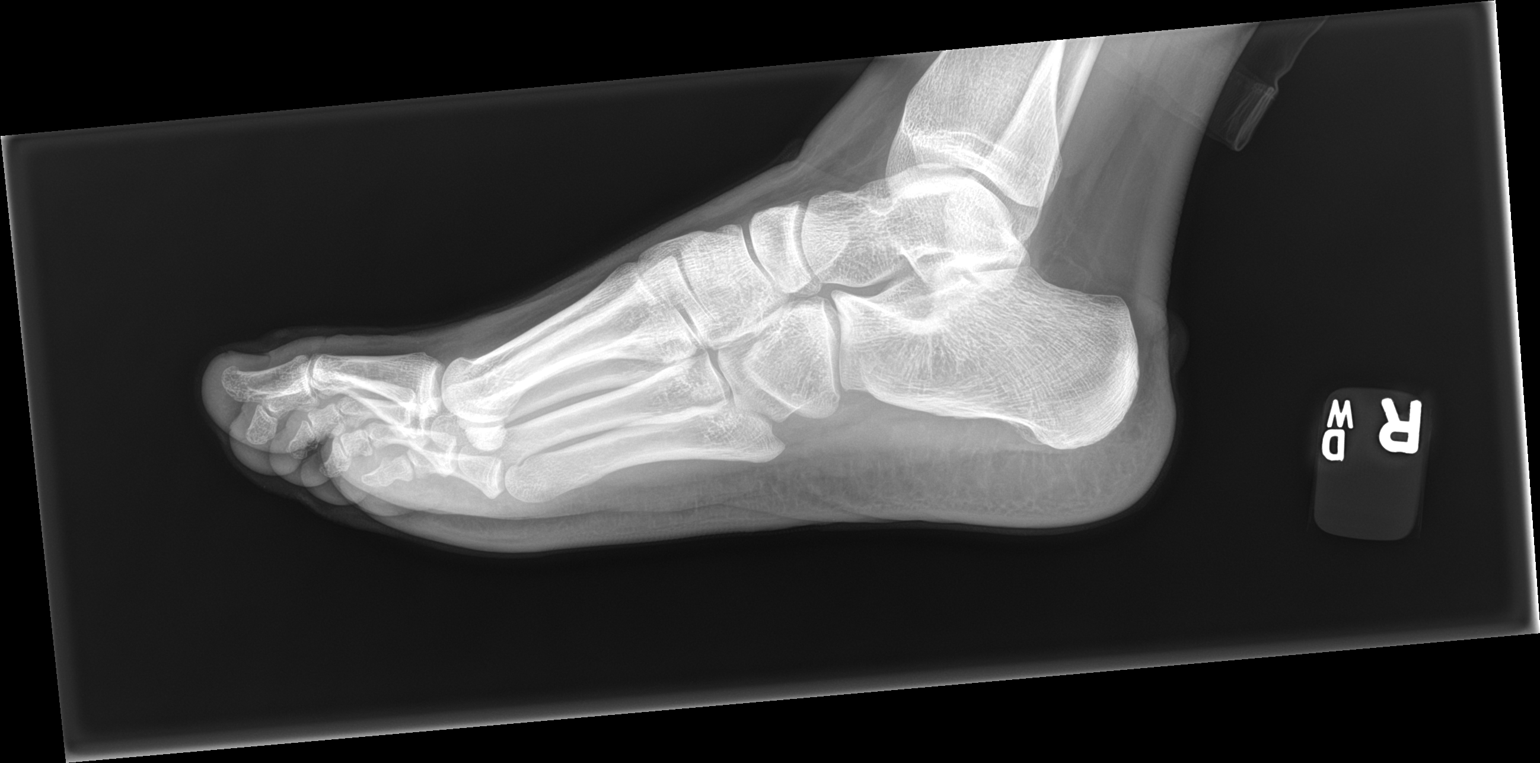

[3 of 3 positions shown; findings below may reference images not displayed]

FINDINGS: There is no evidence of fracture or dislocation. There is no
evidence of arthropathy or other focal bone abnormality. Soft
tissues are unremarkable.
IMPRESSION: Negative.

## 2024-01-16 ENCOUNTER — Other Ambulatory Visit: Payer: Self-pay | Admitting: Obstetrics and Gynecology

## 2024-05-02 ENCOUNTER — Encounter (HOSPITAL_COMMUNITY): Payer: Self-pay

## 2024-05-02 ENCOUNTER — Ambulatory Visit (HOSPITAL_COMMUNITY)
Admission: EM | Admit: 2024-05-02 | Discharge: 2024-05-02 | Disposition: A | Discharge status: Home / Self Care | Payer: Self-pay | Attending: Emergency Medicine | Admitting: Emergency Medicine

## 2024-05-02 DIAGNOSIS — M62830 Muscle spasm of back: Secondary | ICD-10-CM

## 2024-05-02 MED ORDER — BACLOFEN 20 MG PO TABS: 20.0000 mg | ORAL_TABLET | Freq: Three times a day (TID) | ORAL | 0 refills | Status: AC

## 2024-05-02 NOTE — Discharge Instructions (Signed)
 You can take baclofen  every 8 hours as needed for muscle pain and spasms.  This can make you drowsy so do not drive, work, or drink alcohol while taking this. Otherwise alternate between Tylenol  and ibuprofen  as needed for pain. You can also alternate between ice and heat as needed for back pain. Follow-up with [facility] if your pain continues for further evaluation and management. Otherwise follow-up with your primary care provider or return here as needed.
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
## 2024-10-04 ENCOUNTER — Emergency Department (HOSPITAL_BASED_OUTPATIENT_CLINIC_OR_DEPARTMENT_OTHER)
Admission: EM | Admit: 2024-10-04 | Discharge: 2024-10-04 | Disposition: A | Discharge status: Home / Self Care | Payer: Self-pay | Source: Home / Self Care

## 2024-10-04 ENCOUNTER — Other Ambulatory Visit: Payer: Self-pay

## 2024-10-04 ENCOUNTER — Encounter (HOSPITAL_BASED_OUTPATIENT_CLINIC_OR_DEPARTMENT_OTHER): Payer: Self-pay | Admitting: *Deleted

## 2024-10-04 DIAGNOSIS — G43001 Migraine without aura, not intractable, with status migrainosus: Secondary | ICD-10-CM | POA: Insufficient documentation

## 2024-10-04 LAB — CBC WITH DIFFERENTIAL/PLATELET
Abs Immature Granulocytes: 0.02 K/uL (ref 0.00–0.07)
Basophils Absolute: 0 K/uL (ref 0.0–0.1)
Basophils Relative: 1 %
Eosinophils Absolute: 0.1 K/uL (ref 0.0–0.5)
Eosinophils Relative: 1 %
HCT: 37.8 % (ref 36.0–46.0)
Hemoglobin: 13.1 g/dL (ref 12.0–15.0)
Immature Granulocytes: 0 %
Lymphocytes Relative: 31 %
Lymphs Abs: 2.2 K/uL (ref 0.7–4.0)
MCH: 30.8 pg (ref 26.0–34.0)
MCHC: 34.7 g/dL (ref 30.0–36.0)
MCV: 88.7 fL (ref 80.0–100.0)
Monocytes Absolute: 0.4 K/uL (ref 0.1–1.0)
Monocytes Relative: 6 %
Neutro Abs: 4.2 K/uL (ref 1.7–7.7)
Neutrophils Relative %: 61 %
Platelets: 330 K/uL (ref 150–400)
RBC: 4.26 MIL/uL (ref 3.87–5.11)
RDW: 12.2 % (ref 11.5–15.5)
WBC: 7 K/uL (ref 4.0–10.5)
nRBC: 0 % (ref 0.0–0.2)

## 2024-10-04 LAB — BASIC METABOLIC PANEL WITH GFR
Anion gap: 13 (ref 5–15)
BUN: 11 mg/dL (ref 6–20)
CO2: 26 mmol/L (ref 22–32)
Calcium: 9.4 mg/dL (ref 8.9–10.3)
Chloride: 101 mmol/L (ref 98–111)
Creatinine, Ser: 0.76 mg/dL (ref 0.44–1.00)
GFR, Estimated: >60 mL/min
Glucose, Bld: 88 mg/dL (ref 70–99)
Potassium: 3.7 mmol/L (ref 3.5–5.1)
Sodium: 141 mmol/L (ref 135–145)

## 2024-10-04 LAB — HCG, SERUM, QUALITATIVE: Preg, Serum: NEGATIVE

## 2024-10-04 LAB — PREGNANCY, URINE: Preg Test, Ur: NEGATIVE

## 2024-10-04 MED ORDER — ACETAMINOPHEN 500 MG PO TABS
1000.0000 mg | ORAL_TABLET | Freq: Once | ORAL | Status: AC
  Administered 2024-10-04: 1000 mg via ORAL
  Filled 2024-10-04: qty 2

## 2024-10-04 MED ORDER — KETOROLAC TROMETHAMINE 15 MG/ML IJ SOLN
15.0000 mg | Freq: Once | INTRAMUSCULAR | Status: AC
  Administered 2024-10-04: 15 mg via INTRAVENOUS
  Filled 2024-10-04: qty 1

## 2024-10-04 MED ORDER — LACTATED RINGERS IV BOLUS
1000.0000 mL | Freq: Once | INTRAVENOUS | Status: AC
  Administered 2024-10-04: 1000 mL via INTRAVENOUS

## 2024-10-04 MED ORDER — DIPHENHYDRAMINE HCL 50 MG/ML IJ SOLN
12.5000 mg | Freq: Once | INTRAMUSCULAR | Status: AC
  Administered 2024-10-04: 12.5 mg via INTRAVENOUS
  Filled 2024-10-04: qty 1

## 2024-10-04 MED ORDER — PROCHLORPERAZINE EDISYLATE 10 MG/2ML IJ SOLN
10.0000 mg | Freq: Once | INTRAMUSCULAR | Status: AC
  Administered 2024-10-04: 10 mg via INTRAVENOUS
  Filled 2024-10-04: qty 2

## 2024-10-04 NOTE — ED Triage Notes (Signed)
 Pt reports right sided headache since Monday, consistent for 2-3 days. Nausea and vomiting. Last took tylenol  this morning, without relief. Photophobia

## 2024-10-04 NOTE — ED Provider Notes (Signed)
 " [place] [facility] AT [facility] Provider Note   CSN: [id number] Arrival date & time: 10/04/24  1916     Patient presents with: Headache   [name] is a 34 y.o. female with a significant past medical history presents with concern for a right sided headache that has been ongoing for the past 2 to 3 days.  Headache starts behind her right eye and wraps around to the right posterior skull.  Reports that the headache was gradual in onset.  Also associated with photosensitivity and nausea.  She denies any head trauma.  No double vision, fever, or nuchal rigidity.  She reports she sometimes gets headaches, but they have never been this severe or prolonged.  She last took Tylenol  this morning without improvement in symptoms.    Headache      Prior to Admission medications  Medication Sig Start Date End Date Taking? Authorizing Provider  baclofen  (LIORESAL ) 20 MG tablet Take 1 tablet (20 mg total) by mouth 3 (three) times daily. 05/02/24   Johnie Flaming A, NP  medroxyPROGESTERone  (DEPO-PROVERA ) 150 MG/ML injection Inject 1 mL (150 mg total) into the muscle every 3 (three) months. 09/29/21   Dawson, Rolitta, CNM  metoCLOPramide  (REGLAN ) 10 MG tablet Take 1 tablet (10 mg total) by mouth every 6 (six) hours as needed for nausea or vomiting. 12/02/22   Francesca Elsie CROME, MD  oseltamivir  (TAMIFLU ) 75 MG capsule Take 1 capsule (75 mg total) by mouth every 12 (twelve) hours. 11/28/22   Raspet, Erin K, PA-C  promethazine -dextromethorphan (PROMETHAZINE -DM) 6.25-15 MG/5ML syrup Take 5 mLs by mouth 3 (three) times daily as needed for cough. 11/28/22   Raspet, Erin K, PA-C  valACYclovir  (VALTREX ) 500 MG tablet TAKE 1 TABLET BY MOUTH TWICE A DAY 08/21/21   Dawson, Rolitta, CNM    Allergies: Patient has no known allergies.    Review of Systems  Neurological:  Positive for headaches.    Updated Vital Signs BP 108/87   Pulse 87   Temp 98.6 F (37 C) (Oral)   Resp 16    Ht 5' 1 (1.549 m)   Wt 45.4 kg   LMP 09/16/2024   SpO2 98%   BMI 18.89 kg/m   Physical Exam Vitals and nursing note reviewed.  Constitutional:      General: She is not in acute distress.    Appearance: She is well-developed.     Comments: Sitting in room with lights off  HENT:     Head: Normocephalic and atraumatic.  Eyes:     Extraocular Movements: Extraocular movements intact.     Conjunctiva/sclera: Conjunctivae normal.     Pupils: Pupils are equal, round, and reactive to light.  Cardiovascular:     Rate and Rhythm: Normal rate and regular rhythm.     Heart sounds: No murmur heard. Pulmonary:     Effort: Pulmonary effort is normal. No respiratory distress.     Breath sounds: Normal breath sounds.  Abdominal:     Palpations: Abdomen is soft.     Tenderness: There is no abdominal tenderness.  Musculoskeletal:        General: No swelling.     Cervical back: Neck supple.  Skin:    General: Skin is warm and dry.     Capillary Refill: Capillary refill takes less than 2 seconds.  Neurological:     General: No focal deficit present.     Mental Status: She is alert and oriented to person, place, and time.  Psychiatric:        Mood and Affect: Mood normal.     (all labs ordered are listed, but only abnormal results are displayed) Labs Reviewed  CBC WITH DIFFERENTIAL/PLATELET  BASIC METABOLIC PANEL WITH GFR  HCG, SERUM, QUALITATIVE  PREGNANCY, URINE    EKG: None  Radiology: No results found.   Procedures   Medications Ordered in the ED  ketorolac  (TORADOL ) 15 MG/ML injection 15 mg (15 mg Intravenous Given 10/04/24 2109)  acetaminophen  (TYLENOL ) tablet 1,000 mg (1,000 mg Oral Given 10/04/24 2109)  prochlorperazine  (COMPAZINE ) injection 10 mg (10 mg Intravenous Given 10/04/24 2110)  diphenhydrAMINE  (BENADRYL ) injection 12.5 mg (12.5 mg Intravenous Given 10/04/24 2110)  lactated ringers  bolus 1,000 mL (1,000 mLs Intravenous New Bag/Given 10/04/24 2111)    Clinical  Course as of 10/04/24 2211  Thu Oct 04, 2024  2148 Reevaluated patient, she reports her headache has significantly improved.  She requests discharge home. [AF]    Clinical Course User Index [AF] Veta Palma, PA-C                                 Medical Decision Making Amount and/or Complexity of Data Reviewed Labs: ordered.  Risk OTC drugs. Prescription drug management.     Differential diagnosis includes but is not limited to Tension headache, migraine, temporal arteritis, trigeminal neuralgia, cluster headache, meningitis, concussion, intracranial mass, intracranial hemorrhage, carbon monoxide poisoning, sinus venous thrombosis   ED Course:  Upon initial evaluation, patient is sitting in the room with the lights off.  She reports nausea, but is not actively vomiting.  Reporting right-sided headache that starts behind the right eye and wraps around to the right posterior skull.  She does not have any fever, no nuchal rigidity, low concern for meningitis at this time.  She denies any head trauma.  She does not have any neurologic deficits on exam. Head CT head not indicated at this time given low clinical concern for intracranial hemorrhage, intracranial mass, meningitis, sinus venous thrombosis, or other emergent pathology. Will treat for migraine and re-evaluate.   Labs Ordered: I Ordered, and personally interpreted labs.  The pertinent results include:   CBC within normal limits BMP within normal limits Pregnancy test negative   Medications Given: Toradol  Tylenol  Compazine  Benadryl  LR bolus  Upon re-evaluation, patient reports headache has significantly improved.  She states that she wants to go home.  I feel that she is appropriate for discharge home given headache improved, no red flag symptoms, and reassuring exam.  Do not feel she needs head CT given improvement in symptoms.  Given the nature of the headache being one-sided and having associated light sensitivity  and nausea, suspect migraine. Her CBC and BMP are without acute abnormality.  Patient stable and appropriate for discharge home    Impression: Migraine, resolved  Disposition:  The patient was discharged home with instructions to take Tylenol  and ibuprofen  as needed for headaches at home.  Schedule follow-up appointment with her PCP if she is having headaches that are not well-controlled with Tylenol  and ibuprofen  at home, or having more severe persistent headaches. Return precautions given and patient verbalized understanding.   This chart was dictated using voice recognition software, Dragon. Despite the best efforts of this provider to proofread and correct errors, errors may still occur which can change documentation meaning.       Final diagnoses:  Migraine without aura and with status migrainosus, not intractable  ED Discharge Orders     None          Veta Palma, DEVONNA 10/04/24 2211    Gennaro Duwaine CROME, DO 10/07/24 1358  "

## 2024-10-04 NOTE — Discharge Instructions (Signed)
 You were seen today for your headache No specific cause was found today for your headache. It may have been a migraine or other cause of headache. Stress, anxiety, fatigue, weather changes are common triggers for headaches.   You may take up to 1000mg  of tylenol  every 6 hours as needed for headache. Do not take more then 4g per day.  You are given your first dose here today  You may use up to 600mg  ibuprofen  every 6 hours as needed for headache.  Do not exceed 2.4g of ibuprofen  per day.  You were given your first dose here today   Tests performed today include: Your blood testing showed normal blood counts and electrolytes   Follow-up instructions: Please follow-up with your primary care provider if your headaches are not being controlled with over-the-counter medications like Tylenol  and ibuprofen  or becoming more frequent. They can discuss different migraine medication options with you  Return instructions:  Please return to the Emergency Department if you experience worsening symptoms. Return if the medications do not resolve your headache, if it recurs, or if you have multiple episodes of vomiting or cannot keep down fluids. Return if you have a change from your usual headache. RETURN IMMEDIATELY IF you: Develop a sudden, severe headache Develop confusion or become poorly responsive or faint Develop a fever above 100.41F or problem breathing Have a change in speech, vision, swallowing, or understanding Develop new weakness, numbness, tingling, incoordination in your arms or legs Have a seizure Have lots of pain in your neck Please return if you have any other emergent concerns.
